# Patient Record
Sex: Female | Born: 1940 | Race: White | Hispanic: No | Marital: Married | Smoking: Never smoker
Health system: Southern US, Community
[De-identification: ages and names within clinical notes are randomized; demographics above are authoritative.]

## PROBLEM LIST (undated history)

## (undated) DIAGNOSIS — C801 Malignant (primary) neoplasm, unspecified: Secondary | ICD-10-CM

## (undated) DIAGNOSIS — Z923 Personal history of irradiation: Secondary | ICD-10-CM

## (undated) DIAGNOSIS — M810 Age-related osteoporosis without current pathological fracture: Secondary | ICD-10-CM

## (undated) DIAGNOSIS — Z87442 Personal history of urinary calculi: Secondary | ICD-10-CM

## (undated) DIAGNOSIS — C50919 Malignant neoplasm of unspecified site of unspecified female breast: Secondary | ICD-10-CM

## (undated) HISTORY — PX: SPINAL FUSION: SHX223

## (undated) HISTORY — PX: TUBAL LIGATION: SHX77

## (undated) HISTORY — PX: BREAST LUMPECTOMY: SHX2

## (undated) HISTORY — PX: BACK SURGERY: SHX140

## (undated) HISTORY — PX: COLONOSCOPY: SHX174

## (undated) HISTORY — PX: BREAST BIOPSY: SHX20

## (undated) HISTORY — PX: BREAST SURGERY: SHX581

## (undated) HISTORY — DX: Age-related osteoporosis without current pathological fracture: M81.0

---

## 1999-05-29 ENCOUNTER — Encounter: Payer: Self-pay | Admitting: Geriatric Medicine

## 1999-05-29 ENCOUNTER — Encounter: Admission: RE | Admit: 1999-05-29 | Discharge: 1999-05-29 | Payer: Self-pay | Admitting: Geriatric Medicine

## 1999-12-14 ENCOUNTER — Encounter: Payer: Self-pay | Admitting: Obstetrics and Gynecology

## 1999-12-14 ENCOUNTER — Encounter: Admission: RE | Admit: 1999-12-14 | Discharge: 1999-12-14 | Payer: Self-pay | Admitting: Obstetrics and Gynecology

## 1999-12-28 ENCOUNTER — Encounter: Admission: RE | Admit: 1999-12-28 | Discharge: 1999-12-28 | Payer: Self-pay | Admitting: Obstetrics and Gynecology

## 1999-12-28 ENCOUNTER — Encounter: Payer: Self-pay | Admitting: Obstetrics and Gynecology

## 2000-01-25 ENCOUNTER — Other Ambulatory Visit: Admission: RE | Admit: 2000-01-25 | Discharge: 2000-01-25 | Payer: Self-pay | Admitting: Obstetrics and Gynecology

## 2000-12-30 ENCOUNTER — Encounter: Payer: Self-pay | Admitting: Obstetrics and Gynecology

## 2000-12-30 ENCOUNTER — Encounter: Admission: RE | Admit: 2000-12-30 | Discharge: 2000-12-30 | Payer: Self-pay | Admitting: Obstetrics and Gynecology

## 2001-01-14 ENCOUNTER — Other Ambulatory Visit: Admission: RE | Admit: 2001-01-14 | Discharge: 2001-01-14 | Payer: Self-pay | Admitting: Obstetrics and Gynecology

## 2001-12-31 ENCOUNTER — Encounter: Admission: RE | Admit: 2001-12-31 | Discharge: 2001-12-31 | Payer: Self-pay | Admitting: Obstetrics and Gynecology

## 2001-12-31 ENCOUNTER — Encounter: Payer: Self-pay | Admitting: Obstetrics and Gynecology

## 2003-01-04 ENCOUNTER — Encounter: Admission: RE | Admit: 2003-01-04 | Discharge: 2003-01-04 | Payer: Self-pay | Admitting: Obstetrics and Gynecology

## 2003-01-14 ENCOUNTER — Encounter: Admission: RE | Admit: 2003-01-14 | Discharge: 2003-01-14 | Payer: Self-pay | Admitting: Obstetrics and Gynecology

## 2003-01-14 ENCOUNTER — Encounter (INDEPENDENT_AMBULATORY_CARE_PROVIDER_SITE_OTHER): Payer: Self-pay | Admitting: Radiology

## 2003-01-14 ENCOUNTER — Encounter (INDEPENDENT_AMBULATORY_CARE_PROVIDER_SITE_OTHER): Payer: Self-pay | Admitting: Specialist

## 2003-01-25 ENCOUNTER — Encounter (HOSPITAL_COMMUNITY): Admission: RE | Admit: 2003-01-25 | Discharge: 2003-04-25 | Payer: Self-pay | Admitting: General Surgery

## 2003-02-15 ENCOUNTER — Ambulatory Visit (HOSPITAL_COMMUNITY): Admission: RE | Admit: 2003-02-15 | Discharge: 2003-02-15 | Payer: Self-pay | Admitting: General Surgery

## 2003-02-15 ENCOUNTER — Encounter (INDEPENDENT_AMBULATORY_CARE_PROVIDER_SITE_OTHER): Payer: Self-pay | Admitting: Specialist

## 2003-02-15 ENCOUNTER — Ambulatory Visit (HOSPITAL_BASED_OUTPATIENT_CLINIC_OR_DEPARTMENT_OTHER): Admission: RE | Admit: 2003-02-15 | Discharge: 2003-02-15 | Payer: Self-pay | Admitting: General Surgery

## 2003-03-17 ENCOUNTER — Ambulatory Visit: Admission: RE | Admit: 2003-03-17 | Discharge: 2003-03-29 | Payer: Self-pay | Admitting: Radiation Oncology

## 2003-03-25 ENCOUNTER — Ambulatory Visit (HOSPITAL_COMMUNITY): Admission: RE | Admit: 2003-03-25 | Discharge: 2003-03-25 | Payer: Self-pay | Admitting: Oncology

## 2003-03-31 ENCOUNTER — Ambulatory Visit: Admission: RE | Admit: 2003-03-31 | Discharge: 2003-06-04 | Payer: Self-pay | Admitting: Radiation Oncology

## 2003-05-04 ENCOUNTER — Ambulatory Visit (HOSPITAL_COMMUNITY): Admission: RE | Admit: 2003-05-04 | Discharge: 2003-05-04 | Payer: Self-pay | Admitting: Oncology

## 2003-07-06 ENCOUNTER — Ambulatory Visit: Admission: RE | Admit: 2003-07-06 | Discharge: 2003-07-06 | Payer: Self-pay | Admitting: Radiation Oncology

## 2003-11-03 ENCOUNTER — Encounter: Admission: RE | Admit: 2003-11-03 | Discharge: 2003-11-03 | Payer: Self-pay | Admitting: General Surgery

## 2003-12-15 ENCOUNTER — Ambulatory Visit (HOSPITAL_COMMUNITY): Admission: RE | Admit: 2003-12-15 | Discharge: 2003-12-15 | Payer: Self-pay | Admitting: Gastroenterology

## 2004-02-16 ENCOUNTER — Encounter (INDEPENDENT_AMBULATORY_CARE_PROVIDER_SITE_OTHER): Payer: Self-pay | Admitting: *Deleted

## 2004-02-16 ENCOUNTER — Ambulatory Visit (HOSPITAL_BASED_OUTPATIENT_CLINIC_OR_DEPARTMENT_OTHER): Admission: RE | Admit: 2004-02-16 | Discharge: 2004-02-16 | Payer: Self-pay | Admitting: Surgery

## 2004-02-16 ENCOUNTER — Ambulatory Visit (HOSPITAL_COMMUNITY): Admission: RE | Admit: 2004-02-16 | Discharge: 2004-02-16 | Payer: Self-pay | Admitting: Surgery

## 2004-05-26 ENCOUNTER — Ambulatory Visit: Payer: Self-pay | Admitting: Oncology

## 2004-11-17 ENCOUNTER — Ambulatory Visit: Payer: Self-pay | Admitting: Oncology

## 2004-12-06 ENCOUNTER — Encounter: Admission: RE | Admit: 2004-12-06 | Discharge: 2004-12-06 | Payer: Self-pay | Admitting: Obstetrics and Gynecology

## 2005-05-25 ENCOUNTER — Ambulatory Visit: Payer: Self-pay | Admitting: Oncology

## 2005-05-28 LAB — COMPREHENSIVE METABOLIC PANEL
ALT: 14 U/L (ref 0–40)
Albumin: 4.4 g/dL (ref 3.5–5.2)
BUN: 16 mg/dL (ref 6–23)
CO2: 27 mEq/L (ref 19–32)
Calcium: 8.9 mg/dL (ref 8.4–10.5)
Chloride: 105 mEq/L (ref 96–112)
Creatinine, Ser: 0.7 mg/dL (ref 0.4–1.2)
Potassium: 4.1 mEq/L (ref 3.5–5.3)

## 2005-05-28 LAB — CBC WITH DIFFERENTIAL/PLATELET
Basophils Absolute: 0 10*3/uL (ref 0.0–0.1)
Eosinophils Absolute: 0.1 10*3/uL (ref 0.0–0.5)
HCT: 38.2 % (ref 34.8–46.6)
HGB: 13.2 g/dL (ref 11.6–15.9)
LYMPH%: 33.2 % (ref 14.0–48.0)
MONO#: 0.4 10*3/uL (ref 0.1–0.9)
NEUT#: 2.8 10*3/uL (ref 1.5–6.5)
NEUT%: 56.7 % (ref 39.6–76.8)
Platelets: 258 10*3/uL (ref 145–400)
WBC: 5 10*3/uL (ref 3.9–10.0)
lymph#: 1.7 10*3/uL (ref 0.9–3.3)

## 2006-01-01 ENCOUNTER — Encounter: Admission: RE | Admit: 2006-01-01 | Discharge: 2006-01-01 | Payer: Self-pay | Admitting: Oncology

## 2006-01-01 ENCOUNTER — Ambulatory Visit (HOSPITAL_COMMUNITY): Admission: RE | Admit: 2006-01-01 | Discharge: 2006-01-01 | Payer: Self-pay | Admitting: Oncology

## 2006-05-10 ENCOUNTER — Encounter (INDEPENDENT_AMBULATORY_CARE_PROVIDER_SITE_OTHER): Payer: Self-pay | Admitting: *Deleted

## 2006-05-10 ENCOUNTER — Observation Stay (HOSPITAL_COMMUNITY): Admission: EM | Admit: 2006-05-10 | Discharge: 2006-05-11 | Payer: Self-pay | Admitting: Emergency Medicine

## 2006-05-29 ENCOUNTER — Ambulatory Visit: Payer: Self-pay | Admitting: Oncology

## 2006-06-03 LAB — COMPREHENSIVE METABOLIC PANEL
ALT: 15 U/L (ref 0–35)
AST: 19 U/L (ref 0–37)
Albumin: 4.3 g/dL (ref 3.5–5.2)
CO2: 27 mEq/L (ref 19–32)
Calcium: 9.5 mg/dL (ref 8.4–10.5)
Chloride: 105 mEq/L (ref 96–112)
Creatinine, Ser: 0.72 mg/dL (ref 0.40–1.20)
Potassium: 4.5 mEq/L (ref 3.5–5.3)
Total Protein: 7.1 g/dL (ref 6.0–8.3)

## 2006-06-03 LAB — CBC WITH DIFFERENTIAL/PLATELET
BASO%: 0.5 % (ref 0.0–2.0)
EOS%: 1.4 % (ref 0.0–7.0)
HCT: 37.6 % (ref 34.8–46.6)
HGB: 13.3 g/dL (ref 11.6–15.9)
MCHC: 35.4 g/dL (ref 32.0–36.0)
MONO#: 0.4 10*3/uL (ref 0.1–0.9)
NEUT%: 53.7 % (ref 39.6–76.8)
RDW: 13.4 % (ref 11.3–14.5)
WBC: 4.1 10*3/uL (ref 3.9–10.0)
lymph#: 1.4 10*3/uL (ref 0.9–3.3)

## 2006-12-26 ENCOUNTER — Encounter: Admission: RE | Admit: 2006-12-26 | Discharge: 2006-12-26 | Payer: Self-pay | Admitting: Geriatric Medicine

## 2007-01-14 ENCOUNTER — Encounter: Admission: RE | Admit: 2007-01-14 | Discharge: 2007-01-14 | Payer: Self-pay | Admitting: Obstetrics and Gynecology

## 2007-05-20 ENCOUNTER — Ambulatory Visit: Payer: Self-pay | Admitting: Oncology

## 2007-05-22 LAB — CBC WITH DIFFERENTIAL/PLATELET
Eosinophils Absolute: 0.1 10*3/uL (ref 0.0–0.5)
LYMPH%: 32.3 % (ref 14.0–48.0)
MCV: 99.8 fL (ref 81.0–101.0)
MONO%: 8.7 % (ref 0.0–13.0)
NEUT#: 2.8 10*3/uL (ref 1.5–6.5)
NEUT%: 57.4 % (ref 39.6–76.8)
Platelets: 243 10*3/uL (ref 145–400)
RBC: 3.62 10*6/uL — ABNORMAL LOW (ref 3.70–5.32)

## 2007-05-22 LAB — COMPREHENSIVE METABOLIC PANEL
Alkaline Phosphatase: 47 U/L (ref 39–117)
BUN: 16 mg/dL (ref 6–23)
Creatinine, Ser: 0.68 mg/dL (ref 0.40–1.20)
Glucose, Bld: 132 mg/dL — ABNORMAL HIGH (ref 70–99)
Sodium: 141 mEq/L (ref 135–145)
Total Bilirubin: 1.3 mg/dL — ABNORMAL HIGH (ref 0.3–1.2)
Total Protein: 6.9 g/dL (ref 6.0–8.3)

## 2008-01-16 ENCOUNTER — Encounter: Admission: RE | Admit: 2008-01-16 | Discharge: 2008-01-16 | Payer: Self-pay | Admitting: Obstetrics and Gynecology

## 2008-05-21 ENCOUNTER — Ambulatory Visit: Payer: Self-pay | Admitting: Oncology

## 2008-05-25 LAB — CBC WITH DIFFERENTIAL/PLATELET
BASO%: 0.4 % (ref 0.0–2.0)
EOS%: 0.9 % (ref 0.0–7.0)
MCH: 35.2 pg — ABNORMAL HIGH (ref 25.1–34.0)
MCHC: 34.3 g/dL (ref 31.5–36.0)
RBC: 3.68 10*6/uL — ABNORMAL LOW (ref 3.70–5.45)
RDW: 13.1 % (ref 11.2–14.5)
lymph#: 1.2 10*3/uL (ref 0.9–3.3)

## 2008-05-26 LAB — COMPREHENSIVE METABOLIC PANEL
ALT: 14 U/L (ref 0–35)
AST: 20 U/L (ref 0–37)
Albumin: 4.3 g/dL (ref 3.5–5.2)
Calcium: 9 mg/dL (ref 8.4–10.5)
Chloride: 105 mEq/L (ref 96–112)
Creatinine, Ser: 0.71 mg/dL (ref 0.40–1.20)
Potassium: 4.1 mEq/L (ref 3.5–5.3)

## 2008-08-26 ENCOUNTER — Ambulatory Visit: Payer: Self-pay | Admitting: Oncology

## 2008-12-23 ENCOUNTER — Ambulatory Visit: Payer: Self-pay | Admitting: Oncology

## 2009-01-05 LAB — CYP450

## 2009-01-17 ENCOUNTER — Encounter: Admission: RE | Admit: 2009-01-17 | Discharge: 2009-01-17 | Payer: Self-pay | Admitting: Obstetrics and Gynecology

## 2009-01-21 ENCOUNTER — Emergency Department (HOSPITAL_COMMUNITY): Admission: EM | Admit: 2009-01-21 | Discharge: 2009-01-21 | Payer: Self-pay | Admitting: Emergency Medicine

## 2009-05-11 ENCOUNTER — Ambulatory Visit: Payer: Self-pay | Admitting: Oncology

## 2009-05-13 LAB — COMPREHENSIVE METABOLIC PANEL
ALT: 16 U/L (ref 0–35)
AST: 23 U/L (ref 0–37)
Albumin: 4.1 g/dL (ref 3.5–5.2)
Alkaline Phosphatase: 44 U/L (ref 39–117)
BUN: 11 mg/dL (ref 6–23)
CO2: 29 mEq/L (ref 19–32)
Calcium: 8.7 mg/dL (ref 8.4–10.5)
Chloride: 106 mEq/L (ref 96–112)
Creatinine, Ser: 0.64 mg/dL (ref 0.40–1.20)
Glucose, Bld: 90 mg/dL (ref 70–99)
Potassium: 3.9 mEq/L (ref 3.5–5.3)
Sodium: 140 mEq/L (ref 135–145)
Total Bilirubin: 1 mg/dL (ref 0.3–1.2)
Total Protein: 6.5 g/dL (ref 6.0–8.3)

## 2009-05-13 LAB — CBC WITH DIFFERENTIAL/PLATELET
BASO%: 0.5 % (ref 0.0–2.0)
Basophils Absolute: 0 10*3/uL (ref 0.0–0.1)
EOS%: 0.7 % (ref 0.0–7.0)
Eosinophils Absolute: 0 10*3/uL (ref 0.0–0.5)
HCT: 36.9 % (ref 34.8–46.6)
HGB: 12.6 g/dL (ref 11.6–15.9)
LYMPH%: 29.5 % (ref 14.0–49.7)
MCH: 36 pg — ABNORMAL HIGH (ref 25.1–34.0)
MCHC: 34.3 g/dL (ref 31.5–36.0)
MCV: 105 fL — ABNORMAL HIGH (ref 79.5–101.0)
MONO#: 0.5 10*3/uL (ref 0.1–0.9)
MONO%: 8.4 % (ref 0.0–14.0)
NEUT#: 3.3 10*3/uL (ref 1.5–6.5)
NEUT%: 60.9 % (ref 38.4–76.8)
Platelets: 199 10*3/uL (ref 145–400)
RBC: 3.51 10*6/uL — ABNORMAL LOW (ref 3.70–5.45)
RDW: 12.8 % (ref 11.2–14.5)
WBC: 5.4 10*3/uL (ref 3.9–10.3)
lymph#: 1.6 10*3/uL (ref 0.9–3.3)

## 2009-05-13 LAB — VITAMIN D 25 HYDROXY (VIT D DEFICIENCY, FRACTURES): Vit D, 25-Hydroxy: 35 ng/mL (ref 30–89)

## 2009-05-13 LAB — CANCER ANTIGEN 27.29: CA 27.29: 15 U/mL (ref 0–39)

## 2010-01-18 ENCOUNTER — Encounter
Admission: RE | Admit: 2010-01-18 | Discharge: 2010-01-18 | Payer: Self-pay | Source: Home / Self Care | Admitting: Obstetrics and Gynecology

## 2010-03-04 ENCOUNTER — Encounter: Payer: Self-pay | Admitting: Oncology

## 2010-04-13 ENCOUNTER — Other Ambulatory Visit: Payer: Self-pay | Admitting: Dermatology

## 2010-05-12 ENCOUNTER — Encounter (HOSPITAL_BASED_OUTPATIENT_CLINIC_OR_DEPARTMENT_OTHER): Payer: Medicare Other | Admitting: Oncology

## 2010-05-12 ENCOUNTER — Other Ambulatory Visit: Payer: Self-pay | Admitting: Oncology

## 2010-05-12 DIAGNOSIS — C50419 Malignant neoplasm of upper-outer quadrant of unspecified female breast: Secondary | ICD-10-CM

## 2010-05-12 DIAGNOSIS — Z170 Estrogen receptor positive status [ER+]: Secondary | ICD-10-CM

## 2010-05-12 LAB — CBC WITH DIFFERENTIAL/PLATELET
BASO%: 0.4 % (ref 0.0–2.0)
Basophils Absolute: 0 10*3/uL (ref 0.0–0.1)
HCT: 35.3 % (ref 34.8–46.6)
HGB: 12.2 g/dL (ref 11.6–15.9)
MONO#: 0.3 10*3/uL (ref 0.1–0.9)
NEUT#: 3.1 10*3/uL (ref 1.5–6.5)
NEUT%: 56.3 % (ref 38.4–76.8)
WBC: 5.4 10*3/uL (ref 3.9–10.3)
lymph#: 2 10*3/uL (ref 0.9–3.3)

## 2010-05-13 LAB — COMPREHENSIVE METABOLIC PANEL
ALT: 15 U/L (ref 0–35)
BUN: 12 mg/dL (ref 6–23)
CO2: 26 mEq/L (ref 19–32)
Calcium: 8.8 mg/dL (ref 8.4–10.5)
Chloride: 105 mEq/L (ref 96–112)
Creatinine, Ser: 0.77 mg/dL (ref 0.40–1.20)

## 2010-05-13 LAB — CANCER ANTIGEN 27.29: CA 27.29: 18 U/mL (ref 0–39)

## 2010-05-16 LAB — URINALYSIS, ROUTINE W REFLEX MICROSCOPIC
Glucose, UA: NEGATIVE mg/dL
Protein, ur: NEGATIVE mg/dL
Urobilinogen, UA: 0.2 mg/dL (ref 0.0–1.0)

## 2010-05-25 ENCOUNTER — Encounter (HOSPITAL_BASED_OUTPATIENT_CLINIC_OR_DEPARTMENT_OTHER): Payer: Medicare Other | Admitting: Oncology

## 2010-05-25 ENCOUNTER — Other Ambulatory Visit: Payer: Self-pay | Admitting: Oncology

## 2010-05-25 DIAGNOSIS — Z170 Estrogen receptor positive status [ER+]: Secondary | ICD-10-CM

## 2010-05-25 DIAGNOSIS — Z9889 Other specified postprocedural states: Secondary | ICD-10-CM

## 2010-05-25 DIAGNOSIS — C50419 Malignant neoplasm of upper-outer quadrant of unspecified female breast: Secondary | ICD-10-CM

## 2010-05-25 DIAGNOSIS — M810 Age-related osteoporosis without current pathological fracture: Secondary | ICD-10-CM

## 2010-06-30 NOTE — Op Note (Signed)
NAMEANAVI, BRANSCUM                 ACCOUNT NO.:  [id number]   MEDICAL RECORD NO.:  [id number]          PATIENT TYPE:  INP   LOCATION:  1844                         FACILITY:  [facility]   PHYSICIAN:  Currie Paris, M.D.DATE OF BIRTH:  [birth year]   DATE OF PROCEDURE:  05/10/2006  DATE OF DISCHARGE:                               OPERATIVE REPORT   PREOPERATIVE DIAGNOSIS:  Acute calculus cholecystitis.   POSTOPERATIVE DIAGNOSIS:  Acute calculus cholecystitis.   OPERATION:  Laparoscopic cholecystectomy with operative cholangiogram.   SURGEON:  Currie Paris, M.D.   ASSISTANT:  Ollen Gross. Vernell Morgans, M.D.   ANESTHESIA:  General.   CLINICAL HISTORY:  Ms. Rake presented to the emergency room this  morning with acute abdominal pain primarily epigastric right upper  quadrant, associated with nausea and vomiting.  Large stones were seen  in the gallbladder with one apparently impacted in the neck of the  gallbladder.  The diagnosis of acute cholecystitis was made.  We  recommend cholecystectomy done today and she was agreeable to that.  All  questions have been answered.   DESCRIPTION OF PROCEDURE:  The patient was seen in the holding area and  she had no further questions.  We confirmed cholecystectomy as the  planned procedure.   She was taken to the operating room and after satisfactory general  endotracheal anesthesia had been obtained, the abdomen was prepped and  draped.  The time-out occurred.   We used 0.25% plain Marcaine for each incision.  The umbilical incision  was made, the fascia opened, and the peritoneal cavity entered under  direct vision.  A pursestring was placed, the Hasson introduced and the  abdomen insufflated to 15.  Three additional trocars were placed using a  10-11 in the epigastrium and two 5s laterally all placed under direct  vision.   The gallbladder was tense, distended and inflamed.  I used the [employer] and decompressed the  gallbladder.   With some graspers on the gallbladder, I was able to take some  peritoneal adhesions down.  There was a large stone in the neck of the  gallbladder and we grasped the gallbladder around that to retract and  with some gentle dissection and hydrodissection identified the cystic  duct and was able to get a segment of that about 2-2.5 cm long.  I could  see the common duct and the gallbladder.  In addition, I could see the  anterior branch of the cystic artery and we dissected that out.  I put  two clips on the cystic artery, one on the cystic duct at the junction  with the gallbladder.   The cystic duct was opened and a Cook catheter introduced percutaneously  and operative cholangiogram done.  This appeared normal.   Cystic duct catheter was removed and three clips placed on the stay side  of the cystic duct and it was divided.  Additional clip was placed on  the anterior branch of the cystic artery and it was divided.  Further  dissection revealed what appeared to be either be a  large main cystic  artery or an aberrantly placed right hepatic artery with a posterior  cystic artery coming directly off of this.  The anterior cystic artery  also came off this as we could see once it was to divided and retracted  down.  Posterior branch was clipped and divided and the gallbladder then  removed from below to above.  Because of the edema,  there was no good  dissection plane and there was some oozing from the liver bed initially  but this was controlled once the gallbladder was disconnected.  We put  it in a bag while we irrigated and got everything dry.  I placed some  pieces of Surgicel on the bed of the gallbladder.   The gallbladder was removed through the umbilical port.  I had to  enlarge both the skin and fascia a little bit to get the large stones  out.  This came down nicely.   The Roseanne Reno was reintroduced and we spent some more time irrigating,  making sure  everything was dry.  The Surgicel had no collection of blood  developing on it while we were manipulating the gallbladder out.  I did  put one more piece of Surgicel in.   Lateral ports were removed under direct vision.  The pursestring plus an  additional figure-of-eight suture were used to close the umbilical site.  The abdomen was deflated through the epigastric site.  Skin was closed  with 4-0 Monocryl subcuticular plus Dermabond.   The patient tolerated the procedure well.  There were no operative  complications.  All counts were correct.      Currie Paris, M.D.  Electronically Signed     CJS/MEDQ  D:  05/10/2006  T:  05/10/2006  Job:  [id number]   cc:   Hal T. Stoneking, M.D.

## 2010-06-30 NOTE — H&P (Signed)
[place]                 ACCOUNT NO.:  [id number]   MEDICAL RECORD NO.:  [id number]          PATIENT TYPE:  EMS   LOCATION:  MAJO                         FACILITY:  [facility]   PHYSICIAN:  Currie Paris, M.D.DATE OF BIRTH:  1940-04-22   DATE OF ADMISSION:  05/10/2006  DATE OF DISCHARGE:                              HISTORY & PHYSICAL   CHIEF COMPLAINT:  Right upper quadrant pain.   HISTORY OF PRESENT ILLNESS:  This patient is a 70 year old lady who has  been having intermittent indigestion type symptoms for many months.  She  apparently had similar symptoms 10 years ago with a negative workup.  Because of her ongoing symptoms, she was getting ready to have  additional workup next week by Dr. Pete Glatter.   Yesterday, she developed much more severe pain late afternoon.  It was  epigastric and right upper quadrant.  It was associated with intractable  nausea and vomiting.  Because of that, she presented to the emergency  room.  She is been here most of the evening and still having some right  upper quadrant pain and nausea.  The patient has had no prior diagnosis  of gallstones.   The patient notes that the pain was not really made better or worse by  anything that she did in terms of moving, etc.  She did get some relief  with pain medication apparently.   PAST SURGICAL HISTORY:  She had a lumpectomy for breast cancer.   MEDICATIONS:  She is on Detrol, Fosamax, and Exemestane.   ALLERGIES:  No known allergies.   SOCIAL HISTORY:  She does not smoke, drinks occasionally.  She exercises  regularly, walks daily and works out at [employer].  She is married.   REVIEW OF SYSTEMS:  HEENT: Basically negative.  CHEST: No cough,  shortness of breath.  HEART:  No history of murmurs, hypertension, chest  pain, angina. BREASTS:  A history of breast cancer.  ABDOMEN:  Other  than current symptoms, no other GI problems.  GU: Has bladder  dysfunction and takes Detrol.  EXTREMITIES:   Negative.   PHYSICAL EXAMINATION:  VITAL SIGNS:  Temperature 97, pulse 83,  respirations 18, blood pressure 150/76.  GENERAL APPEARANCE:  Patient is alert, oriented, well-nourished, well-  developed female who is slightly uncomfortable but in no acute distress.  Mood and affect are normal.  HEENT:  Head normocephalic.  Eyes nonicteric.  Pupils equal, round and  regular.  Pharynx:  Good dentition.  Normal mucosa.  NECK:  Supple.  No masses, no thyromegaly.  LUNGS:  Normal respirations.  Clear to auscultation.  HEART:  Regular rhythm.  No murmurs, rubs or gallops.  Peripheral pulses  intact.  Dorsalis pedis, posterior tibial.  No varicosities noted.  BREASTS:  Status post eye lumpectomy.  ABDOMEN:  Mild right upper quadrant tenderness, but the abdomen has no  guarding.  It is soft.  There is no rebound tenderness.  There is no  masses palpable.  There is no organomegaly noted.  She has no hernias  noted.  EXTREMITIES:  No cyanosis or edema.  Good range of motion noted.   LABORATORY DATA:  White count is 10,900.  There is 88% neutrophils.  Liver functions appear normal with a total bilirubin of 1.2.  Hemoglobin  is 14.  Lipase is 27.   Gallbladder ultrasound shows approximately 2 cm stone which appears  impacted in the neck of the gallbladder.   Chest x-ray and EKG are pending.   IMPRESSION:  1. Acute cholecystitis and cholelithiasis.  2. History of breast cancer in remission.   PLAN:  We are going to continue to give her some IV fluids, pain  medication.  I will start her on some Ancef.  Will plan to do a  laparoscopic cholecystectomy today.  I have discussed the risks and  complications with the patient and her husband and I think all questions  have been answered.      Currie Paris, M.D.  Electronically Signed     CJS/MEDQ  D:  05/10/2006  T:  05/10/2006  Job:  [id number]   cc:   Hal T. Stoneking, M.D.  Valentino Hue. Magrinat, M.D.

## 2010-06-30 NOTE — Op Note (Signed)
NAMEJACINTA, [place]                 ACCOUNT NO.:  [id number]   MEDICAL RECORD NO.:  [id number]          PATIENT TYPE:  AMB   LOCATION:  ENDO                         FACILITY:  [facility]   PHYSICIAN:  Danise Edge, M.D.   DATE OF BIRTH:  [birth year]   DATE OF PROCEDURE:  12/15/2003  DATE OF DISCHARGE:                                 OPERATIVE REPORT   INDICATIONS FOR PROCEDURE:  Ms. [name] is a 70 year old female born  Sep 17, 1940.  Ms. [name] was scheduled to undergo her first screening  colonoscopy with polypectomy to prevent colon cancer.  Due to colonic loop  formation, I was only able to perform a flexible proctosigmoidoscopy to 50  cm.   ENDOSCOPIST:  Danise Edge, M.D.   PREMEDICATION:  Versed 6 mg and Demerol 60 mg.   DESCRIPTION OF PROCEDURE:  After obtaining informed consent, Ms. [name] was  placed in the left lateral decubitus position.  I administered intravenous  Demerol and intravenous Versed to achieve conscious sedation for the  procedure.  The patient's blood pressure, oxygen saturation and cardiac  rhythm were monitored throughout the procedure and documented in the medical  record. Anal inspection and digital rectal examination were normal.  The  Olympus adjustable pediatric colonoscope was introduced into the rectum and  advanced to 50 cm from the anal verge.  Despite patient repositioning and  the application of external abdominal pressure, I was unable to advance the  colonoscope proximally and a complete colonoscopy was not performed.  Colonic preparation for the examination today was excellent.   Endoscopic appearance of the rectum and sigmoid colon with the colonoscope  advanced to 50 cm from the anal verge reveals left colonic diverticulosis  but no endoscopic evidence for the presence of colorectal neoplasia.   I will refer Ms. [name] to the radiology suite for an air contrast barium  enema to screen the remainder of her colon.      MJ/MEDQ  D:   12/15/2003  T:  12/15/2003  Job:  [id number]   cc:   Hal T. Stoneking, M.D.  [address] [place], [place] [address]  Fax: [phone]

## 2010-06-30 NOTE — Op Note (Signed)
Caroline Harper, [place]                           ACCOUNT NO.:  [id number]   MEDICAL RECORD NO.:  [id number]                   PATIENT TYPE:  AMB   LOCATION:  DSC                                  FACILITY:  [facility]   PHYSICIAN:  Rose Phi. Maple Hudson, M.D.                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   POSTOPERATIVE DIAGNOSIS:  Stage I carcinoma of the left breast.   OPERATION:  1. Blue dye injection.  2. Left sentinel lymph node biopsy.  3. Left lumpectomy.   SURGEON:  Rose Phi. Maple Hudson, M.D.   ANESTHESIA:  General.   DESCRIPTION OF PROCEDURE:  Prior to coming to the operating room 1 mc of  technetium sulphur colloid was injected intradermally, and then she was  brought to the operating room where general anesthesia was induced, and she  was placed in the supine position, with both arms extended on the arm board.  Then 5 mL of a mixture of 2 mL of methylene blue and 3 mL of saline was  injected into the subareolar tissue and the breast gently massaged for about  three minutes.  After prepping and draping the breast and axilla, we carefully scanned the  axilla with the Neo-Probe and she had one good hot spot.  A short transverse  axillary incision was made with dissection through the subcutaneous tissue  to the clavipectoral fascia.  A blue and hot sentinel node was identified  and removed and submitted to the pathologist.  There were no other blue hot  or palpable nodes.  While that was being evaluated, a curved incision over the palpable area in  the upper outer quadrant of her left breast was made, and a wide excision  carried out.  The specimen oriented for the pathologist.  Touch preps of the sentinel node were negative.  Touch preps of the margins  were clean.  We had good hemostasis  with the cautery, and then closed the  wounds with #3-0 Vicryl and  subcuticular #4-0 Monocryl and Steri-Strips.  Dressings applied.  The patient was transferred to the recovery room in satisfactory condition,  having tolerated the procedure well.                                               Rose Phi. Maple Hudson, M.D.    PRY/MEDQ  D:  02/15/2003  T:  02/15/2003  Job:  [id number]   cc:   Hal T. Stoneking, M.D.  [address]  [place], [place] [address]  Fax: [phone]   Tracey Harries, M.D.

## 2010-06-30 NOTE — Op Note (Signed)
Caroline Harper, [place] Harper                 ACCOUNT NO.:  [id number]   MEDICAL RECORD NO.:  [id number]          PATIENT TYPE:  AMB   LOCATION:  DSC                          FACILITY:  [facility]   PHYSICIAN:  Currie Paris, M.D.DATE OF BIRTH:  13-Oct-1940   DATE OF PROCEDURE:  02/16/2004  DATE OF DISCHARGE:                                 OPERATIVE REPORT   PREOPERATIVE DIAGNOSIS:  Pilar cyst x2 of scalp.   POSTOPERATIVE DIAGNOSIS:  Pilar cyst x2 of scalp.   OPERATION/PROCEDURE:  Excision pilar cyst x2 of scalp.   SURGEON:  Currie Paris, M.D.   ANESTHESIA:  Local.   CLINICAL HISTORY:  This patient has had an enlarging left pilar cyst and a  smaller right one that she apparently bumped or at any rate she had a little  skin breakdown on the surface that had not healed.  She decided to have both  of these electively removed.   DESCRIPTION OF PROCEDURE:  The patient was seen in the minor procedure room  and both were identified and marked. The hair was clipped over each of them  and they were anesthetized with 1% Xylocaine with epinephrine, using a total  of 10 mL.   I waited about 10 minutes and then began on the anterior one which had the  nonhealing area.  It was excised in toto and the incision closed with 3-0  Monocryl plus some Dermabond.   The patient was repositioned to get the larger one more posteriorly and this  was also prepped with Betadine, an elliptical incision made and it was  excised and closed with 3-0 Monocryl.  Dermabond was also used.  The patient  tolerated the procedure well.  The smaller anterior incision was about 1 cm  and the posterior one was 3 cm.  The patient tolerated the procedure well.  There were no complications noted.      Chri   CJS/MEDQ  D:  02/16/2004  T:  02/16/2004  Job:  [id number]

## 2010-12-27 ENCOUNTER — Other Ambulatory Visit: Payer: Self-pay | Admitting: *Deleted

## 2010-12-27 MED ORDER — TAMOXIFEN CITRATE 20 MG PO TABS: 20.0000 mg | ORAL_TABLET | Freq: Every day | ORAL | Status: AC

## 2011-01-22 ENCOUNTER — Ambulatory Visit
Admission: RE | Admit: 2011-01-22 | Discharge: 2011-01-22 | Disposition: A | Discharge status: Home / Self Care | Payer: Medicare Other | Source: Ambulatory Visit | Attending: Oncology | Admitting: Oncology

## 2011-01-22 DIAGNOSIS — Z9889 Other specified postprocedural states: Secondary | ICD-10-CM

## 2011-05-01 ENCOUNTER — Other Ambulatory Visit: Payer: Self-pay | Admitting: Dermatology

## 2011-05-09 ENCOUNTER — Encounter: Payer: Self-pay | Admitting: *Deleted

## 2011-05-28 ENCOUNTER — Other Ambulatory Visit: Payer: Medicare Other | Admitting: Lab

## 2011-05-29 ENCOUNTER — Other Ambulatory Visit: Payer: Self-pay | Admitting: Dermatology

## 2011-05-30 ENCOUNTER — Telehealth: Payer: Self-pay | Admitting: Oncology

## 2011-05-30 ENCOUNTER — Ambulatory Visit (HOSPITAL_BASED_OUTPATIENT_CLINIC_OR_DEPARTMENT_OTHER): Payer: Medicare Other | Admitting: Oncology

## 2011-05-30 ENCOUNTER — Other Ambulatory Visit (HOSPITAL_BASED_OUTPATIENT_CLINIC_OR_DEPARTMENT_OTHER): Payer: Medicare Other | Admitting: Lab

## 2011-05-30 VITALS — BP 144/77 | HR 80 | Temp 97.9°F | Ht 66.0 in | Wt 127.8 lb

## 2011-05-30 DIAGNOSIS — Z170 Estrogen receptor positive status [ER+]: Secondary | ICD-10-CM

## 2011-05-30 DIAGNOSIS — C50419 Malignant neoplasm of upper-outer quadrant of unspecified female breast: Secondary | ICD-10-CM

## 2011-05-30 DIAGNOSIS — C50919 Malignant neoplasm of unspecified site of unspecified female breast: Secondary | ICD-10-CM | POA: Insufficient documentation

## 2011-05-30 LAB — CBC WITH DIFFERENTIAL/PLATELET
BASO%: 1.6 % (ref 0.0–2.0)
EOS%: 1.3 % (ref 0.0–7.0)
HCT: 36.4 % (ref 34.8–46.6)
MCH: 35.6 pg — ABNORMAL HIGH (ref 25.1–34.0)
MCHC: 33.9 g/dL (ref 31.5–36.0)
MONO#: 0.4 10*3/uL (ref 0.1–0.9)
NEUT%: 48.4 % (ref 38.4–76.8)
RBC: 3.47 10*6/uL — ABNORMAL LOW (ref 3.70–5.45)
RDW: 13.1 % (ref 11.2–14.5)
WBC: 3.9 10*3/uL (ref 3.9–10.3)
lymph#: 1.5 10*3/uL (ref 0.9–3.3)
nRBC: 0 % (ref 0–0)

## 2011-05-30 MED ORDER — TAMOXIFEN CITRATE 20 MG PO TABS: 20.0000 mg | ORAL_TABLET | Freq: Every day | ORAL | Status: AC

## 2011-05-30 NOTE — Telephone Encounter (Signed)
gve the pt her April 2014 appt calendar °

## 2011-05-30 NOTE — Progress Notes (Signed)
ID: Caroline Harper   DOB: 01/28/1941  MR#: [id number]  WUJ#:[id number]  HISTORY OF PRESENT ILLNESS: Caroline Harper had her routine mammogram at [facility] on November 18th showing an irregularity in the left breast. The right breast was unremarkable.  Spot compression and additional views showed an irregular mass at the 2  o'clock position, 1 cm from the left nipple (December 2nd).  Ultrasound core biopsy was obtained on the same day and showed ([id number]) an invasive mammary carcinoma that was ER/PR positive, HER-2/neu negative.  With this information, the patient was referred to Dr. Francina Harper.  After appropriate discussion, she was referred for left lumpectomy and axillary lymph node biopsy February 15, 2003.  The final pathology there ([id number]) showed a 2.2 cm Grade 1 infiltrating ductal carcinoma with negative margins.  No evidence of lymphovascular invasion, but a micrometastasis in a single lymph node examined.  Her subsequent history is as detailed below  INTERVAL HISTORY: Caroline Harper returns today for followup of her breast cancer. Interval history is quite unremarkable. She's doing a lot of traveling, enjoying her grandchildren when she gets to visit (they're currently in [place]) and generally "feeling great".  REVIEW OF SYSTEMS: She lives 6 months of the year in [place], [place] 6 months she goes to [place]. The other 6 [place] daily walking there. She is tolerating tamoxifen with no side effects that she is aware of. A detailed review of systems was noncontributory  PAST MEDICAL HISTORY: Significant for surgery for treatment of scoliosis as a teenager, and this required some spinal fusion and some correction of her left knee.  She is also status post bilateral tubal ligation.    FAMILY HISTORY The patient's father died at the age of 19 with vasculitis.  Patient's mother died at the age of 26 with congestive heart failure.  The patient has one brother and one sister. There is no  history of cancer in the family.    GYNECOLOGIC HISTORY: She is G2 P2. She did take hormone replacement for a Caroline.  SOCIAL HISTORY: She is a retired Advice worker. She has been married to her husband, [place] Harper, for >40 years.  He is retired from [place]. Their son, [place] Harper, in [place], works for [place]. Their daughter, [place] Harper, is in [place] and has two children.  She is married to an Radio broadcast assistant. The [place] Harper are members of [place] Harper.     ADVANCED DIRECTIVES:  HEALTH MAINTENANCE: History  Substance Use Topics  . Smoking status: Not on file  . Smokeless tobacco: Not on file  . Alcohol Use: Not on file    Allergies no known allergies  Current Outpatient Prescriptions  Medication Sig Dispense Refill  . tamoxifen (NOLVADEX) 20 MG tablet Take 20 mg by mouth daily.        OBJECTIVE: middle-aged white woman who appears healthy Filed Vitals:   05/30/11 0858  BP: 144/77  Pulse: 80  Temp: 97.9 F (36.6 C)     Body mass index is 20.63 kg/(m^2).    ECOG FS: 0  Sclerae unicteric Oropharynx clear No peripheral adenopathy Lungs no rales or rhonchi Heart regular rate and rhythm Abd benign MSK scoliosis butno focal spinal tenderness, no peripheral edema Neuro: nonfocal Breasts: right breast, no suspicious findings; left breast status post lumpectomy, no evidence of local recurrence. Both breasts are small and fairly lumpy for age. There has been no significant change since the last visit to my recollection  LAB RESULTS: Lab  Results  Component Value Date   WBC 3.9 05/30/2011   NEUTROABS 1.9 05/30/2011   HGB 12.3 05/30/2011   HCT 36.4 05/30/2011   MCV 104.9* 05/30/2011   PLT 167 05/30/2011      Chemistry         Lab Results  Component Value Date   LABCA2 18 05/12/2010   LABCA2 18 05/12/2010   LABCA2 18 05/12/2010    No components found with this basename: [id number]    No results found for this basename: INR:1;PROTIME:1 in the last 168  hours  Urinalysis    Component Value Date/Caroline   COLORURINE YELLOW 01/21/2009 0644   APPEARANCEUR CLEAR 01/21/2009 0644   LABSPEC 1.021 01/21/2009 0644   PHURINE 7.5 01/21/2009 0644   GLUCOSEU NEGATIVE 01/21/2009 0644   HGBUR NEGATIVE 01/21/2009 0644   BILIRUBINUR NEGATIVE 01/21/2009 0644   KETONESUR 15* 01/21/2009 0644   PROTEINUR NEGATIVE 01/21/2009 0644   UROBILINOGEN 0.2 01/21/2009 0644   NITRITE NEGATIVE 01/21/2009 0644   LEUKOCYTESUR NEGATIVE MICROSCOPIC NOT DONE ON URINES WITH NEGATIVE PROTEIN, BLOOD, LEUKOCYTES, NITRITE, OR GLUCOSE <1000 mg/dL. 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  Status post radiation therapy.  On exemestane from April of 2005 until April of 2010 per the MA27 study.  Began on tamoxifen 20 mg daily in July of 2010.    PLAN: the plan is to continue tamoxifen 2 total 5 years, which will be July of 2015. We did discuss the recent data suggesting that continuing anti-estrogens for 10 years offers superior protection. She will see [place] again in one year and then again 2 years from now which is when we will discontinue followup. She knows to call for any problems that may develop before the next visit.   Tom Macpherson C    05/30/2011

## 2011-05-31 LAB — COMPREHENSIVE METABOLIC PANEL
AST: 20 U/L (ref 0–37)
Albumin: 4.1 g/dL (ref 3.5–5.2)
Alkaline Phosphatase: 37 U/L — ABNORMAL LOW (ref 39–117)
Glucose, Bld: 92 mg/dL (ref 70–99)
Potassium: 3.7 mEq/L (ref 3.5–5.3)
Sodium: 144 mEq/L (ref 135–145)
Total Bilirubin: 0.9 mg/dL (ref 0.3–1.2)
Total Protein: 6.4 g/dL (ref 6.0–8.3)

## 2011-05-31 LAB — CANCER ANTIGEN 27.29: CA 27.29: 14 U/mL (ref 0–39)

## 2011-06-04 ENCOUNTER — Ambulatory Visit: Payer: Medicare Other | Admitting: Oncology

## 2011-12-25 ENCOUNTER — Other Ambulatory Visit: Payer: Self-pay | Admitting: Oncology

## 2011-12-25 DIAGNOSIS — Z9889 Other specified postprocedural states: Secondary | ICD-10-CM

## 2011-12-25 DIAGNOSIS — Z853 Personal history of malignant neoplasm of breast: Secondary | ICD-10-CM

## 2012-01-23 ENCOUNTER — Ambulatory Visit
Admission: RE | Admit: 2012-01-23 | Discharge: 2012-01-23 | Disposition: A | Discharge status: Home / Self Care | Payer: Medicare Other | Source: Ambulatory Visit | Attending: Oncology | Admitting: Oncology

## 2012-01-23 DIAGNOSIS — Z853 Personal history of malignant neoplasm of breast: Secondary | ICD-10-CM

## 2012-01-23 DIAGNOSIS — Z9889 Other specified postprocedural states: Secondary | ICD-10-CM

## 2012-05-29 ENCOUNTER — Ambulatory Visit: Payer: Medicare Other | Admitting: Physician Assistant

## 2012-05-29 ENCOUNTER — Other Ambulatory Visit: Payer: Medicare Other | Admitting: Lab

## 2012-06-04 ENCOUNTER — Other Ambulatory Visit: Payer: Self-pay | Admitting: Physician Assistant

## 2012-06-04 DIAGNOSIS — C50919 Malignant neoplasm of unspecified site of unspecified female breast: Secondary | ICD-10-CM

## 2012-06-05 ENCOUNTER — Other Ambulatory Visit (HOSPITAL_BASED_OUTPATIENT_CLINIC_OR_DEPARTMENT_OTHER): Payer: Medicare PPO | Admitting: Lab

## 2012-06-05 ENCOUNTER — Encounter: Payer: Self-pay | Admitting: Physician Assistant

## 2012-06-05 ENCOUNTER — Ambulatory Visit (HOSPITAL_BASED_OUTPATIENT_CLINIC_OR_DEPARTMENT_OTHER): Payer: Medicare PPO | Admitting: Physician Assistant

## 2012-06-05 ENCOUNTER — Telehealth: Payer: Self-pay | Admitting: Oncology

## 2012-06-05 VITALS — BP 122/72 | HR 80 | Temp 97.9°F | Resp 20 | Ht 66.0 in | Wt 128.7 lb

## 2012-06-05 DIAGNOSIS — Z780 Asymptomatic menopausal state: Secondary | ICD-10-CM

## 2012-06-05 DIAGNOSIS — Z853 Personal history of malignant neoplasm of breast: Secondary | ICD-10-CM

## 2012-06-05 DIAGNOSIS — C50919 Malignant neoplasm of unspecified site of unspecified female breast: Secondary | ICD-10-CM

## 2012-06-05 DIAGNOSIS — C50419 Malignant neoplasm of upper-outer quadrant of unspecified female breast: Secondary | ICD-10-CM

## 2012-06-05 DIAGNOSIS — M810 Age-related osteoporosis without current pathological fracture: Secondary | ICD-10-CM

## 2012-06-05 DIAGNOSIS — C50912 Malignant neoplasm of unspecified site of left female breast: Secondary | ICD-10-CM

## 2012-06-05 DIAGNOSIS — Z170 Estrogen receptor positive status [ER+]: Secondary | ICD-10-CM

## 2012-06-05 HISTORY — DX: Age-related osteoporosis without current pathological fracture: M81.0

## 2012-06-05 LAB — CBC WITH DIFFERENTIAL/PLATELET
Eosinophils Absolute: 0.1 10*3/uL (ref 0.0–0.5)
HCT: 37.5 % (ref 34.8–46.6)
LYMPH%: 30.7 % (ref 14.0–49.7)
MCHC: 33.9 g/dL (ref 31.5–36.0)
MCV: 104.5 fL — ABNORMAL HIGH (ref 79.5–101.0)
MONO#: 0.5 10*3/uL (ref 0.1–0.9)
MONO%: 7.7 % (ref 0.0–14.0)
NEUT#: 3.6 10*3/uL (ref 1.5–6.5)
NEUT%: 60.4 % (ref 38.4–76.8)
Platelets: 202 10*3/uL (ref 145–400)
WBC: 6 10*3/uL (ref 3.9–10.3)

## 2012-06-05 LAB — COMPREHENSIVE METABOLIC PANEL (CC13)
ALT: 15 U/L (ref 0–55)
Alkaline Phosphatase: 50 U/L (ref 40–150)
Creatinine: 0.7 mg/dL (ref 0.6–1.1)
Sodium: 142 mEq/L (ref 136–145)
Total Bilirubin: 0.53 mg/dL (ref 0.20–1.20)
Total Protein: 6.7 g/dL (ref 6.4–8.3)

## 2012-06-05 MED ORDER — TAMOXIFEN CITRATE 20 MG PO TABS: 20.0000 mg | ORAL_TABLET | Freq: Every day | ORAL | Status: DC

## 2012-06-05 NOTE — Progress Notes (Signed)
ID: Caroline Harper   DOB: [birth year]  MR#: [id number]  CSN#:[id number]  PCP: Ginette Otto, MD GYN:  SU:  OTHER MD:     HISTORY OF PRESENT ILLNESS: Caroline Harper had her routine mammogram at [facility] on December 31, 2002 showing an irregularity in the left breast. The right breast was unremarkable. Spot compression and additional views showed an irregular mass at the 2 o'clock position, 1 cm from the left nipple (December 2nd). Ultrasound core biopsy was obtained on the same day and showed ([id number]) an invasive mammary carcinoma that was ER/PR positive, HER-2/neu negative.   With this information, the patient was referred to Dr. Francina Ames. After appropriate discussion, she was referred for left lumpectomy and axillary lymph node biopsy February 15, 2003. The final pathology there ([id number]) showed a 2.2 cm Grade 1 infiltrating ductal carcinoma with negative margins. No evidence of lymphovascular invasion, but a micrometastasis in a single lymph node examined. Her subsequent history is as detailed below    INTERVAL HISTORY: Caroline Harper returns today for one-year followup of her left breast carcinoma. She continues on tamoxifen with excellent tolerance. She has no problems with hot flashes, no peripheral swelling, abnormal bleeding, or evidence of abnormal clotting. She's noted no change in vision. She denies any vaginal dryness or discharge.  Interval history is remarkable for Caroline Harper having recently returned from a 12 day trip out west. In fact she got back into town at 2:00 AM this morning! Needless to say, she is a little tired today, but tells me that otherwise her energy level is great.  REVIEW OF SYSTEMS: Caroline Harper has had no recent illnesses and denies any fevers, chills, or night sweats. She's had no skin changes or rashes. She's eating and drinking well denies any nausea or change in bowel or bladder habits. She's had no new cough, no shortness of breath, orthopnea, chest pain, or palpitations. She denies any  abnormal headaches, or dizziness, and has had no unusual myalgias, arthralgias, or bony pain.  A detailed review of systems is otherwise noncontributory.   PAST MEDICAL HISTORY: Past Medical History  Diagnosis Date  . Osteoporosis, unspecified 06/05/2012    PAST SURGICAL HISTORY: History reviewed. No pertinent past surgical history. Significant for surgery for treatment of scoliosis as a teenager, and this required some spinal fusion and some correction of her left knee.  She is also status post bilateral tubal ligation.    FAMILY HISTORY History reviewed. No pertinent family history. The patient's father died at the age of 34 with vasculitis.  Patient's mother died at the age of 16 with congestive heart failure.  The patient has one brother and one sister. There is no history of cancer in the family.    GYNECOLOGIC HISTORY: She is G2 P2. She did take hormone replacement for a time.  SOCIAL HISTORY: She is a retired Advice worker. She has been married to her husband, Chestine Spore, for over 40 years.  He is retired from [employer]. Their son, Brett Canales, in [place], works for [employer] [place]. Their daughter, Herbert Seta, is in [place] and has two children.  She is married to an Radio broadcast assistant. The Raneys are members of [employer].      ADVANCED DIRECTIVES: In Place.  Husband, Chestine Spore, is HCPOA.  HEALTH MAINTENANCE: History  Substance Use Topics  . Smoking status: Never Smoker   . Smokeless tobacco: Never Used  . Alcohol Use: Yes     Colonoscopy:  PAP: UTD  Bone density:  May 2012, osteoporosis  Lipid panel:  UTD  No Known Allergies  Current Outpatient Prescriptions  Medication Sig Dispense Refill  . calcium-vitamin D (OSCAL WITH D) 500-200 MG-UNIT per tablet Take 1 tablet by mouth 2 (two) times daily.      . fish oil-omega-3 fatty acids 1000 MG capsule Take 1 g by mouth daily.      . Multiple Vitamin (MULTIVITAMIN) tablet Take 1 tablet by mouth daily.      . tamoxifen  (NOLVADEX) 20 MG tablet Take 1 tablet (20 mg total) by mouth daily.  90 tablet  3  . vitamin C (ASCORBIC ACID) 500 MG tablet Take 500 mg by mouth daily.       No current facility-administered medications for this visit.    OBJECTIVE: Middle-aged Caucasian female who appears comfortable and is in no acute distress Filed Vitals:   06/05/12 1458  BP: 122/72  Pulse: 80  Temp: 97.9 F (36.6 C)  Resp: 20     Body mass index is 20.78 kg/(m^2).    ECOG FS: 0 Filed Weights   06/05/12 1458  Weight: 128 lb 11.2 oz (58.378 kg)    Sclerae unicteric Oropharynx clear, buccal mucosa is pink and moist No cervical or supraclavicular adenopathy Lungs clear to auscultation bilaterally, no wheezes, rales or rhonchi Heart regular rate and rhythm Abdomen soft, nontender to palpation, positive bowel sounds MSK no focal spinal tenderness to palpation, no peripheral edema Neuro: nonfocal, well oriented with positive affect Breasts: Right breast is unremarkable. Left breast is status post lumpectomy with no suspicious nodularities or evidence of local recurrence. Axillae are benign bilaterally the palpable adenopathy noted.   LAB RESULTS: Lab Results  Component Value Date   WBC 6.0 06/05/2012   NEUTROABS 3.6 06/05/2012   HGB 12.7 06/05/2012   HCT 37.5 06/05/2012   MCV 104.5* 06/05/2012   PLT 202 06/05/2012      Chemistry      Component Value Date/Time   NA 142 06/05/2012 1442   NA 144 05/30/2011 0837   K 4.1 06/05/2012 1442   K 3.7 05/30/2011 0837   CL 108* 06/05/2012 1442   CL 108 05/30/2011 0837   CO2 27 06/05/2012 1442   CO2 28 05/30/2011 0837   BUN 14.3 06/05/2012 1442   BUN 14 05/30/2011 0837   CREATININE 0.7 06/05/2012 1442   CREATININE 0.64 05/30/2011 0837      Component Value Date/Time   CALCIUM 8.7 06/05/2012 1442   CALCIUM 8.3* 05/30/2011 0837   ALKPHOS 50 06/05/2012 1442   ALKPHOS 37* 05/30/2011 0837   AST 24 06/05/2012 1442   AST 20 05/30/2011 0837   ALT 15 06/05/2012 1442   ALT 14  05/30/2011 0837   BILITOT 0.53 06/05/2012 1442   BILITOT 0.9 05/30/2011 0837       Lab Results  Component Value Date   LABCA2 14 05/30/2011     STUDIES:  Most recent bone density at [facility] in May 2012 showed osteoporosis.  [facility] was unremarkable.   ASSESSMENT: 72 y.o.  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   (2)  Status post radiation therapy.   (3)  On exemestane from April of 2005 until April of 2010 per the MA27 study.   (4)  Began on tamoxifen 20 mg daily in July of  2010.    PLAN: Caroline Harper is doing extremely well clinically with regards to her breast cancer, and is tolerating the tamoxifen with no complaints whatsoever. At this point, we will make no changes in our plan. She will continue on tamoxifen for at least another year and will see Dr. Darnelle Catalan in April 2015. At that time, they will discuss whether  to continue or discontinue tamoxifen.    In the meanwhile, we will also repeat her bone density, and she'll be due for her next annual mammogram in December 2014.  All this was reviewed with Caroline Harper today and she voices understanding and agreement with our plan. She knows to call with any changes or problems.  Caroline Harper    06/05/2012

## 2012-06-09 ENCOUNTER — Ambulatory Visit: Payer: Medicare Other | Admitting: Physician Assistant

## 2012-12-30 ENCOUNTER — Telehealth: Payer: Self-pay | Admitting: *Deleted

## 2012-12-30 NOTE — Telephone Encounter (Signed)
Lm informed the pt that [facility] will be in [facility] ON 05/27/13 during the AM. gv appt for 05/27/13 @ 1pm. Pt is aware that i will mail a letter/avs...td

## 2013-01-23 ENCOUNTER — Ambulatory Visit
Admission: RE | Admit: 2013-01-23 | Discharge: 2013-01-23 | Disposition: A | Discharge status: Home / Self Care | Payer: Medicare PPO | Source: Ambulatory Visit | Attending: Physician Assistant | Admitting: Physician Assistant

## 2013-01-23 DIAGNOSIS — Z853 Personal history of malignant neoplasm of breast: Secondary | ICD-10-CM

## 2013-01-23 DIAGNOSIS — Z780 Asymptomatic menopausal state: Secondary | ICD-10-CM

## 2013-01-23 DIAGNOSIS — M810 Age-related osteoporosis without current pathological fracture: Secondary | ICD-10-CM

## 2013-01-23 DIAGNOSIS — C50912 Malignant neoplasm of unspecified site of left female breast: Secondary | ICD-10-CM

## 2013-02-23 ENCOUNTER — Other Ambulatory Visit: Payer: Self-pay | Admitting: Dermatology

## 2013-03-16 ENCOUNTER — Other Ambulatory Visit: Payer: Self-pay | Admitting: Gastroenterology

## 2013-04-28 ENCOUNTER — Encounter (HOSPITAL_COMMUNITY): Payer: Self-pay | Admitting: Pharmacy Technician

## 2013-04-28 ENCOUNTER — Encounter (HOSPITAL_COMMUNITY): Payer: Self-pay | Admitting: *Deleted

## 2013-04-28 DIAGNOSIS — C801 Malignant (primary) neoplasm, unspecified: Secondary | ICD-10-CM

## 2013-04-28 HISTORY — PX: CHOLECYSTECTOMY: SHX55

## 2013-04-28 HISTORY — DX: Malignant (primary) neoplasm, unspecified: C80.1

## 2013-05-04 ENCOUNTER — Telehealth: Payer: Self-pay | Admitting: Oncology

## 2013-05-04 NOTE — Telephone Encounter (Signed)
pt called to r/s lab...done...pt aware of new d.t °

## 2013-05-06 ENCOUNTER — Ambulatory Visit (HOSPITAL_BASED_OUTPATIENT_CLINIC_OR_DEPARTMENT_OTHER): Payer: Medicare PPO

## 2013-05-06 DIAGNOSIS — Z853 Personal history of malignant neoplasm of breast: Secondary | ICD-10-CM

## 2013-05-06 DIAGNOSIS — M810 Age-related osteoporosis without current pathological fracture: Secondary | ICD-10-CM

## 2013-05-06 DIAGNOSIS — C50419 Malignant neoplasm of upper-outer quadrant of unspecified female breast: Secondary | ICD-10-CM

## 2013-05-06 DIAGNOSIS — Z780 Asymptomatic menopausal state: Secondary | ICD-10-CM

## 2013-05-06 DIAGNOSIS — C50912 Malignant neoplasm of unspecified site of left female breast: Secondary | ICD-10-CM

## 2013-05-06 LAB — CBC WITH DIFFERENTIAL/PLATELET
BASO%: 0.6 % (ref 0.0–2.0)
Basophils Absolute: 0 10*3/uL (ref 0.0–0.1)
EOS ABS: 0 10*3/uL (ref 0.0–0.5)
EOS%: 0.5 % (ref 0.0–7.0)
HEMATOCRIT: 38.4 % (ref 34.8–46.6)
HEMOGLOBIN: 12.9 g/dL (ref 11.6–15.9)
LYMPH%: 31.5 % (ref 14.0–49.7)
MCH: 35.3 pg — ABNORMAL HIGH (ref 25.1–34.0)
MCHC: 33.5 g/dL (ref 31.5–36.0)
MCV: 105.4 fL — ABNORMAL HIGH (ref 79.5–101.0)
MONO#: 0.5 10*3/uL (ref 0.1–0.9)
MONO%: 8.9 % (ref 0.0–14.0)
NEUT%: 58.5 % (ref 38.4–76.8)
NEUTROS ABS: 3.1 10*3/uL (ref 1.5–6.5)
Platelets: 201 10*3/uL (ref 145–400)
RBC: 3.64 10*6/uL — ABNORMAL LOW (ref 3.70–5.45)
RDW: 12.7 % (ref 11.2–14.5)
WBC: 5.4 10*3/uL (ref 3.9–10.3)
lymph#: 1.7 10*3/uL (ref 0.9–3.3)

## 2013-05-06 LAB — COMPREHENSIVE METABOLIC PANEL (CC13)
ALBUMIN: 4.2 g/dL (ref 3.5–5.0)
ALT: 17 U/L (ref 0–55)
AST: 24 U/L (ref 5–34)
Alkaline Phosphatase: 41 U/L (ref 40–150)
Anion Gap: 12 mEq/L — ABNORMAL HIGH (ref 3–11)
BUN: 12.6 mg/dL (ref 7.0–26.0)
CALCIUM: 9.3 mg/dL (ref 8.4–10.4)
CHLORIDE: 105 meq/L (ref 98–109)
CO2: 25 mEq/L (ref 22–29)
CREATININE: 0.7 mg/dL (ref 0.6–1.1)
GLUCOSE: 92 mg/dL (ref 70–140)
POTASSIUM: 4 meq/L (ref 3.5–5.1)
Sodium: 142 mEq/L (ref 136–145)
Total Bilirubin: 1.03 mg/dL (ref 0.20–1.20)
Total Protein: 7.1 g/dL (ref 6.4–8.3)

## 2013-05-12 ENCOUNTER — Ambulatory Visit (HOSPITAL_COMMUNITY)
Admission: RE | Admit: 2013-05-12 | Discharge: 2013-05-12 | Disposition: A | Discharge status: Home / Self Care | Payer: Medicare PPO | Source: Ambulatory Visit | Attending: Gastroenterology | Admitting: Gastroenterology

## 2013-05-12 ENCOUNTER — Encounter (HOSPITAL_COMMUNITY): Payer: Medicare PPO | Admitting: Anesthesiology

## 2013-05-12 ENCOUNTER — Encounter (HOSPITAL_COMMUNITY): Payer: Self-pay

## 2013-05-12 ENCOUNTER — Ambulatory Visit (HOSPITAL_COMMUNITY): Payer: Medicare PPO | Admitting: Anesthesiology

## 2013-05-12 ENCOUNTER — Encounter (HOSPITAL_COMMUNITY)
Admission: RE | Disposition: A | Discharge status: Home / Self Care | Payer: Self-pay | Source: Ambulatory Visit | Attending: Gastroenterology

## 2013-05-12 DIAGNOSIS — K5730 Diverticulosis of large intestine without perforation or abscess without bleeding: Secondary | ICD-10-CM | POA: Insufficient documentation

## 2013-05-12 DIAGNOSIS — Z853 Personal history of malignant neoplasm of breast: Secondary | ICD-10-CM | POA: Insufficient documentation

## 2013-05-12 DIAGNOSIS — Z1211 Encounter for screening for malignant neoplasm of colon: Secondary | ICD-10-CM | POA: Insufficient documentation

## 2013-05-12 DIAGNOSIS — Z9010 Acquired absence of unspecified breast and nipple: Secondary | ICD-10-CM | POA: Insufficient documentation

## 2013-05-12 DIAGNOSIS — M810 Age-related osteoporosis without current pathological fracture: Secondary | ICD-10-CM | POA: Insufficient documentation

## 2013-05-12 DIAGNOSIS — Z9221 Personal history of antineoplastic chemotherapy: Secondary | ICD-10-CM | POA: Insufficient documentation

## 2013-05-12 HISTORY — PX: COLONOSCOPY WITH PROPOFOL: SHX5780

## 2013-05-12 HISTORY — DX: Malignant (primary) neoplasm, unspecified: C80.1

## 2013-05-12 HISTORY — DX: Personal history of urinary calculi: Z87.442

## 2013-05-12 SURGERY — COLONOSCOPY WITH PROPOFOL
Anesthesia: Monitor Anesthesia Care

## 2013-05-12 MED ORDER — PROPOFOL 10 MG/ML IV BOLUS
INTRAVENOUS | Status: AC
  Filled 2013-05-12: qty 20

## 2013-05-12 MED ORDER — LIDOCAINE HCL (CARDIAC) 20 MG/ML IV SOLN
INTRAVENOUS | Status: AC
  Filled 2013-05-12: qty 5

## 2013-05-12 MED ORDER — LACTATED RINGERS IV SOLN
INTRAVENOUS | Status: DC | PRN
  Administered 2013-05-12: 08:00:00 via INTRAVENOUS

## 2013-05-12 MED ORDER — LIDOCAINE HCL (CARDIAC) 20 MG/ML IV SOLN
INTRAVENOUS | Status: DC | PRN
  Administered 2013-05-12: 60 mg via INTRAVENOUS

## 2013-05-12 MED ORDER — LACTATED RINGERS IV SOLN
INTRAVENOUS | Status: DC
  Administered 2013-05-12: 1000 mL via INTRAVENOUS

## 2013-05-12 MED ORDER — SODIUM CHLORIDE 0.9 % IV SOLN
INTRAVENOUS | Status: DC
Start: 2013-05-12 — End: 2013-05-12

## 2013-05-12 MED ORDER — PROPOFOL INFUSION 10 MG/ML OPTIME
INTRAVENOUS | Status: DC | PRN
  Administered 2013-05-12: 200 ug/kg/min via INTRAVENOUS

## 2013-05-12 SURGICAL SUPPLY — 21 items

## 2013-05-12 NOTE — Op Note (Signed)
Procedure: Screening colonoscopy. November 2005 normal flexible proctosigmoidoscopy followed by air contrast barium enema.  Endoscopist: Johnson  Premedication: Propofol administered by anesthesia  Procedure: The patient was placed in the left lateral decubitus position. Anal inspection and digital rectal exam were normal. The Pentax pediatric colonoscope was introduced into the rectum and advanced to the cecum. A normal-appearing appendiceal orifice and ileocecal valve were identified. Colonic preparation for the exam today was good.  Rectum. Normal. Retroflexed view of the distal rectum normal  Sigmoid colon and descending colon. Extensive left colonic diverticulosis  Splenic flexure. Normal  Transverse colon. Normal  Hepatic flexure. Normal  Ascending colon. Normal  Cecum and ileocecal valve. Normal  Assessment: Normal screening proctocolonoscopy to the cecum. Extensive left colonic diverticulosis

## 2013-05-12 NOTE — Transfer of Care (Signed)
Immediate Anesthesia Transfer of Care Note  Patient: Caroline Harper  Procedure(s) Performed: Procedure(s): COLONOSCOPY WITH PROPOFOL (N/A)  Patient Location: PACU  Anesthesia Type:MAC  Level of Consciousness: awake and alert   Airway & Oxygen Therapy: Patient connected to face mask oxygen  Post-op Assessment: Report given to PACU RN and Post -op Vital signs reviewed and stable  Post vital signs: Reviewed and stable  Complications: No apparent anesthesia complications

## 2013-05-12 NOTE — H&P (Signed)
  Procedure: Screening colonoscopy  History: The patient is a 73 year old female born [birth year]. She is scheduled to undergo a screening colonoscopy today.  In November 2005, she underwent a normal incomplete proctocolonoscopy performed to 50 cm from the anal verge followed by a normal air contrast barium enema.  Past medical history: Left breast lumpectomy to treat infiltrating ductal carcinoma of the left breast in 2005. Osteoporosis. Right knee surgery.  Medication allergies: None  Habits: The patient does not smoke cigarettes. She consumes alcohol in moderation.  Exam: The patient is alert and lying comfortably on the endoscopy stretcher. Abdomen is soft and nontender to palpation. Lungs are clear to auscultation. Cardiac exam reveals a regular rhythm.  Plan: Proceed with screening colonoscopy

## 2013-05-12 NOTE — Anesthesia Preprocedure Evaluation (Signed)
Anesthesia Evaluation  Patient identified by MRN, date of birth, ID band Patient awake    Reviewed: Allergy & Precautions, H&P , NPO status , Patient's Chart, lab work & pertinent test results  Airway Mallampati: II TM Distance: >3 FB Neck ROM: Full    Dental  (+) Teeth Intact, Dental Advisory Given   Pulmonary neg pulmonary ROS,  breath sounds clear to auscultation  Pulmonary exam normal       Cardiovascular negative cardio ROS  Rhythm:Regular Rate:Normal     Neuro/Psych negative neurological ROS  negative psych ROS   GI/Hepatic negative GI ROS, Neg liver ROS,   Endo/Other  negative endocrine ROS  Renal/GU negative Renal ROS  negative genitourinary   Musculoskeletal negative musculoskeletal ROS (+)   Abdominal   Peds  Hematology negative hematology ROS (+)   Anesthesia Other Findings History of left breast CA; s/p lumpectomy/chemo.  Reproductive/Obstetrics                           Anesthesia Physical Anesthesia Plan  ASA: II  Anesthesia Plan: MAC   Post-op Pain Management:    Induction: Intravenous  Airway Management Planned: Simple Face Mask  Additional Equipment:   Intra-op Plan:   Post-operative Plan:   Informed Consent: I have reviewed the patients History and Physical, chart, labs and discussed the procedure including the risks, benefits and alternatives for the proposed anesthesia with the patient or authorized representative who has indicated his/her understanding and acceptance.   Dental advisory given  Plan Discussed with: CRNA  Anesthesia Plan Comments:         Anesthesia Quick Evaluation

## 2013-05-12 NOTE — Anesthesia Postprocedure Evaluation (Signed)
Anesthesia Post Note  Patient: Caroline Harper  Procedure(s) Performed: Procedure(s) (LRB): COLONOSCOPY WITH PROPOFOL (N/A)  Anesthesia type: MAC  Patient location: PACU  Post pain: Pain level controlled  Post assessment: Post-op Vital signs reviewed  Last Vitals:  Filed Vitals:   05/12/13 0909  BP: 136/90  Pulse:   Temp:   Resp: 13    Post vital signs: Reviewed  Level of consciousness: sedated  Complications: No apparent anesthesia complications

## 2013-05-12 NOTE — Discharge Instructions (Signed)
Monitored Anesthesia Care  Monitored anesthesia care is an anesthesia service for a medical procedure. Anesthesia is the loss of the ability to feel pain. It is produced by medications called anesthetics. It may affect a small area of your body (local anesthesia), a large area of your body (regional anesthesia), or your entire body (general anesthesia). The need for monitored anesthesia care depends your procedure, your condition, and the potential need for regional or general anesthesia. It is often provided during procedures where:   General anesthesia may be needed if there are complications. This is because you need special care when you are under general anesthesia.   You will be under local or regional anesthesia. This is so that you are able to have higher levels of anesthesia if needed.   You will receive calming medications (sedatives). This is especially the case if sedatives are given to put you in a semi-conscious state of relaxation (deep sedation). This is because the amount of sedative needed to produce this state can be hard to predict. Too much of a sedative can produce general anesthesia. Monitored anesthesia care is performed by one or more caregivers who have special training in all types of anesthesia. You will need to meet with these caregivers before your procedure. During this meeting, they will ask you about your medical history. They will also give you instructions to follow. (For example, you will need to stop eating and drinking before your procedure. You may also need to stop or change medications you are taking.) During your procedure, your caregivers will stay with you. They will:   Watch your condition. This includes watching you blood pressure, breathing, and level of pain.   Diagnose and treat problems that occur.   Give medications if they are needed. These may include calming medications (sedatives) and anesthetics.   Make sure you are comfortable.  Having  monitored anesthesia care does not necessarily mean that you will be under anesthesia. It does mean that your caregivers will be able to manage anesthesia if you need it or if it occurs. It also means that you will be able to have a different type of anesthesia than you are having if you need it. When your procedure is complete, your caregivers will continue to watch your condition. They will make sure any medications wear off before you are allowed to go home.  Document Released: 10/25/2004 Document Revised: 05/26/2012 Document Reviewed: 03/12/2012 [facility] Patient Information 2014 [place], [place]. Colonoscopy A colonoscopy is an exam to look at your colon. This exam helps to find lumps (tumors), growths (polyps), puffiness (swelling), and bleeding in your colon.  BEFORE THE PROCEDURE  You may need to drink clear liquids for 2 days before the exam.  Ask your doctor about changing or stopping your regular medicines.  You may need liquid or medicines put in your butt (enema or laxatives) to help you poop.  You may need to drink a liquid over a short amount of time. This liquid cleans your colon.  Ask your doctor what time you need to arrive. PROCEDURE A tube is put in the opening of your butt (anus) and into your colon. The doctor will look for anything that is not normal. Your doctor may take a tissue sample (biopsy) from your colon to be looked at more closely. AFTER THE PROCEDURE  Have someone drive you home if you took pain medicine or a medicine to relax you (sedative).  You may see blood in your poop (stool). This is  normal.  You may pass gas and have belly (abdominal) cramps. This is normal. Finding out the results of your test Ask when your test results will be ready. Make sure you get your test results. Document Released: 03/03/2010 Document Revised: 05/26/2012 Document Reviewed: 10/06/2012 [facility] Patient Information 2014 [place].

## 2013-05-13 ENCOUNTER — Encounter (HOSPITAL_COMMUNITY): Payer: Self-pay | Admitting: Gastroenterology

## 2013-05-22 ENCOUNTER — Other Ambulatory Visit: Payer: Medicare PPO

## 2013-05-27 ENCOUNTER — Ambulatory Visit (HOSPITAL_BASED_OUTPATIENT_CLINIC_OR_DEPARTMENT_OTHER): Payer: Medicare PPO | Admitting: Oncology

## 2013-05-27 ENCOUNTER — Ambulatory Visit: Payer: Medicare PPO | Admitting: Oncology

## 2013-05-27 VITALS — BP 128/76 | HR 86 | Temp 98.4°F | Resp 18 | Ht 66.0 in | Wt 130.2 lb

## 2013-05-27 DIAGNOSIS — Z853 Personal history of malignant neoplasm of breast: Secondary | ICD-10-CM

## 2013-05-27 DIAGNOSIS — C50919 Malignant neoplasm of unspecified site of unspecified female breast: Secondary | ICD-10-CM

## 2013-05-27 DIAGNOSIS — M810 Age-related osteoporosis without current pathological fracture: Secondary | ICD-10-CM

## 2013-05-27 NOTE — Progress Notes (Signed)
ID: Caroline Harper   DOB: 08/15/1940  MR#: [id number]  LNL#:[id number]  PCP: Mathews Argyle, MD GYN:  SU:  OTHER MD:  Earle Gell M.D.   HISTORY OF PRESENT ILLNESS: Caroline Harper had her routine mammogram at [facility] on December 31, 2002 showing an irregularity in the left breast. The right breast was unremarkable. Spot compression and additional views showed an irregular mass at the 2 o'clock position, 1 cm from the left nipple (December 2nd). Ultrasound core biopsy was obtained on the same day and showed ([id number]) an invasive mammary carcinoma that was ER/PR positive, HER-2/neu negative.   With this information, the patient was referred to Dr. Marylene Buerger. After appropriate discussion, she was referred for left lumpectomy and axillary lymph node biopsy February 15, 2003. The final pathology there ([id number]) showed a 2.2 cm Grade 1 infiltrating ductal carcinoma with negative margins. No evidence of lymphovascular invasion, but a micrometastasis in a single lymph node examined. Her subsequent history is as detailed below    INTERVAL HISTORY: Caroline Harper returns today for one-year followup of her left breast carcinoma. She is tolerating her tamoxifen without any unusual side effects and in particular with no hot flashes or vaginal discharge. She is now ready to "graduate" from followup here.  REVIEW OF SYSTEMS: [place] continues to travel and to exercise regularly. She has had some hoarseness for the last year. This has been evaluated by ENT with no specific pathology noted. She has also had a change in her bowel habits. They were firm and once a day but the last few months have been 2-3 times in the morning and a bit looser. She just had a colonoscopy under Dr. Durenda Age which was unremarkable. However she tells me she has been doing a lot of "sugarless gum" which of course can take sorbitol. That likely is the culprit.   A detailed review of systems today was otherwise noncontributory  PAST MEDICAL  HISTORY: Past Medical History  Diagnosis Date  . Osteoporosis, unspecified 06/05/2012  . History of kidney stones     x1   . Cancer 04-28-13    '04-left Breast-lumpectomy,radiation ,oral med.-Dr. Jana Hakim follows.    PAST SURGICAL HISTORY: Past Surgical History  Procedure Laterality Date  . Back surgery      Complete spinal fusion(hx. scoliosis)  . Tubal ligation    . Breast surgery Left     '04-lumpectomy.  . Colonoscopy      past hx, x1 - no significant findings  . Cholecystectomy  04-28-13    3'08- laparoscopic -  . Colonoscopy with propofol N/A 05/12/2013    Procedure: COLONOSCOPY WITH PROPOFOL;  Surgeon: Garlan Fair, MD;  Location: [facility] ENDOSCOPY;  Service: Endoscopy;  Laterality: N/A;   Significant for surgery for treatment of scoliosis as a teenager, and this required some spinal fusion and some correction of her left knee.  She is also status post bilateral tubal ligation.    FAMILY HISTORY No family history on file. The patient's father died at the age of 30 with vasculitis.  Patient's mother died at the age of 82 with congestive heart failure.  The patient has one brother and one sister. There is no history of cancer in the family.    GYNECOLOGIC HISTORY: She is G2 P2. She did take hormone replacement for a time.  SOCIAL HISTORY: She is a retired Proofreader. She has been married to her husband, Carlis Harper, for over 40 years.  He is retired from [employer]. Their son, Richardson Landry, in [place],  works for [employer]. Their daughter, Nira Conn, is in [place] and has two children.  She is married to an Animator. The Raneys are members of [employer].      ADVANCED DIRECTIVES: In Place.  Husband, Carlis Harper, is HCPOA.  HEALTH MAINTENANCE: History  Substance Use Topics  . Smoking status: Never Smoker   . Smokeless tobacco: Never Used  . Alcohol Use: 0.6 oz/week    1 Glasses of wine per week     Comment: wine daily at dinner     Colonoscopy:  PAP:  UTD  Bone density:  May 2012, osteoporosis  Lipid panel:  UTD  No Known Allergies  Current Outpatient Prescriptions  Medication Sig Dispense Refill  . calcium-vitamin D (OSCAL WITH D) 500-200 MG-UNIT per tablet Take 1 tablet by mouth 2 (two) times daily.      . fish oil-omega-3 fatty acids 1000 MG capsule Take 1 g by mouth daily.      . Multiple Vitamin (MULTIVITAMIN) tablet Take 1 tablet by mouth daily.      . tamoxifen (NOLVADEX) 20 MG tablet Take 20 mg by mouth every morning.      . vitamin C (ASCORBIC ACID) 500 MG tablet Take 500 mg by mouth daily.       No current facility-administered medications for this visit.    OBJECTIVE: Middle-aged Caucasian female who appears stated age  73 Vitals:   05/27/13 1324  BP: 128/76  Pulse: 86  Temp: 98.4 F (36.9 C)  Resp: 18     Body mass index is 21.02 kg/(m^2).    ECOG FS: 0 Filed Weights   05/27/13 1324  Weight: 130 lb 3.2 oz (59.058 kg)    Sclerae unicteric, pupils round and reactive  Oropharynx clear and moist  No cervical or supraclavicular adenopathy Lungs clear to auscultation bilaterally Heart regular rate and rhythm Abdomen soft, nontender, positive bowel sounds MSK no focal spinal tenderness no peripheral edema Neuro: nonfocal, well oriented, positive affect Breasts: Right breast is unremarkable Other than for some seborrheic keratoses.. Left breast is status post lumpectomy with no evidence of local recurrence.The left axilla is benign    LAB RESULTS: Lab Results  Component Value Date   WBC 5.4 05/06/2013   NEUTROABS 3.1 05/06/2013   HGB 12.9 05/06/2013   HCT 38.4 05/06/2013   MCV 105.4* 05/06/2013   PLT 201 05/06/2013      Chemistry      Component Value Date/Time   NA 142 05/06/2013 1140   NA 144 05/30/2011 0837   K 4.0 05/06/2013 1140   K 3.7 05/30/2011 0837   CL 108* 06/05/2012 1442   CL 108 05/30/2011 0837   CO2 25 05/06/2013 1140   CO2 28 05/30/2011 0837   BUN 12.6 05/06/2013 1140   BUN 14 05/30/2011 0837    CREATININE 0.7 05/06/2013 1140   CREATININE 0.64 05/30/2011 0837      Component Value Date/Time   CALCIUM 9.3 05/06/2013 1140   CALCIUM 8.3* 05/30/2011 0837   ALKPHOS 41 05/06/2013 1140   ALKPHOS 37* 05/30/2011 0837   AST 24 05/06/2013 1140   AST 20 05/30/2011 0837   ALT 17 05/06/2013 1140   ALT 14 05/30/2011 0837   BILITOT 1.03 05/06/2013 1140   BILITOT 0.9 05/30/2011 0837       Lab Results  Component Value Date   LABCA2 14 05/30/2011     STUDIES: CLINICAL DATA: Screening. History of left lumpectomy for breast  cancer 2005.  EXAM:  DIGITAL SCREENING BILATERAL MAMMOGRAM WITH CAD  COMPARISON: Previous exam(s).  ACR Breast Density Category d: The breasts are extremely dense,  which lowers the sensitivity of mammography.  FINDINGS:  There are no findings suspicious for malignancy. Images were  processed with CAD. Left lumpectomy changes are reidentified.  IMPRESSION:  No mammographic evidence of malignancy. A result letter of this  screening mammogram will be mailed directly to the patient.  RECOMMENDATION:  Screening mammogram in one year. (Code:SM-B-01Y)  BI-RADS CATEGORY 1: Negative  Electronically Signed  By: Conchita Paris M.D.  On: 01/27/2013 10:58  Most recent bone density at [facility] in May 2014 showed osteoporosis with a Tscore of -3.0.   ASSESSMENT: 73 y.o.  [place] woman   (1)  status post left lumpectomy and sentinel lymph node biopsy in January of 2005 for a T2 N1, stage IIB invasive ductal carcinoma, Grade 1, ER/PR positive and HER2/neu negative.   (2)  Status post radiation therapy.   (3)  On exemestane from April of 2005 until April of 2010 per the MA27 study.   (4)  On tamoxifen July of 2010 to April 2015.  PLAN: She has completed 10 years of followup and antiestrogen therapy and I am very comfortable releasing her to her primary care physician at this point. All she needs as far as breast cancer followup is concerned with a yearly mammogram and yearly  physician breast exam.  She knows we keep her records about 10 years and so if there is any question or concerns she can call or schedule a visit. As of now however no routine return appointments are being made for her here.Virgie Dad Magrinat    05/27/2013

## 2013-05-28 ENCOUNTER — Telehealth: Payer: Self-pay | Admitting: Oncology

## 2013-05-28 ENCOUNTER — Telehealth: Payer: Self-pay | Admitting: *Deleted

## 2013-05-28 NOTE — Telephone Encounter (Signed)
Sent letter to Dr. Christiane Ha T. [place]akuli office from Dr. Jana Hakim

## 2013-05-28 NOTE — Telephone Encounter (Signed)
Message left by patient stating she was seen yesterday and " released from care, but I do not know if I need to continue on the tamoxifen ?"  Pt left return call number as [phone].  This RN returned call and obtained identified VM- message left informing pt she has completed tamoxifen therapy and may return call to this RN if additional questions.

## 2013-10-01 ENCOUNTER — Other Ambulatory Visit: Payer: Self-pay | Admitting: Obstetrics and Gynecology

## 2013-10-02 ENCOUNTER — Encounter (HOSPITAL_COMMUNITY): Payer: Self-pay

## 2013-10-02 ENCOUNTER — Encounter (HOSPITAL_COMMUNITY)
Admission: RE | Admit: 2013-10-02 | Discharge: 2013-10-02 | Disposition: A | Discharge status: Home / Self Care | Payer: Medicare PPO | Source: Ambulatory Visit | Attending: Obstetrics and Gynecology | Admitting: Obstetrics and Gynecology

## 2013-10-02 DIAGNOSIS — N950 Postmenopausal bleeding: Secondary | ICD-10-CM | POA: Diagnosis present

## 2013-10-02 DIAGNOSIS — N840 Polyp of corpus uteri: Secondary | ICD-10-CM | POA: Diagnosis not present

## 2013-10-02 LAB — COMPREHENSIVE METABOLIC PANEL
ALT: 17 U/L (ref 0–35)
ANION GAP: 10 (ref 5–15)
AST: 27 U/L (ref 0–37)
Albumin: 4 g/dL (ref 3.5–5.2)
Alkaline Phosphatase: 51 U/L (ref 39–117)
BUN: 16 mg/dL (ref 6–23)
CO2: 30 mEq/L (ref 19–32)
Calcium: 8.7 mg/dL (ref 8.4–10.5)
Chloride: 102 mEq/L (ref 96–112)
Creatinine, Ser: 0.65 mg/dL (ref 0.50–1.10)
GFR calc non Af Amer: 87 mL/min — ABNORMAL LOW (ref >90)
GLUCOSE: 151 mg/dL — AB (ref 70–99)
Potassium: 4 mEq/L (ref 3.7–5.3)
Sodium: 142 mEq/L (ref 137–147)
TOTAL PROTEIN: 6.9 g/dL (ref 6.0–8.3)
Total Bilirubin: 0.8 mg/dL (ref 0.3–1.2)

## 2013-10-02 LAB — CBC WITH DIFFERENTIAL/PLATELET
Basophils Absolute: 0 10*3/uL (ref 0.0–0.1)
Basophils Relative: 1 % (ref 0–1)
Eosinophils Absolute: 0 10*3/uL (ref 0.0–0.7)
Eosinophils Relative: 1 % (ref 0–5)
HCT: 38.1 % (ref 36.0–46.0)
HEMOGLOBIN: 12.9 g/dL (ref 12.0–15.0)
LYMPHS ABS: 2.1 10*3/uL (ref 0.7–4.0)
LYMPHS PCT: 38 % (ref 12–46)
MCH: 35.1 pg — ABNORMAL HIGH (ref 26.0–34.0)
MCHC: 33.9 g/dL (ref 30.0–36.0)
MCV: 103.5 fL — ABNORMAL HIGH (ref 78.0–100.0)
MONOS PCT: 9 % (ref 3–12)
Monocytes Absolute: 0.5 10*3/uL (ref 0.1–1.0)
NEUTROS ABS: 2.9 10*3/uL (ref 1.7–7.7)
Neutrophils Relative %: 51 % (ref 43–77)
Platelets: 185 10*3/uL (ref 150–400)
RBC: 3.68 MIL/uL — ABNORMAL LOW (ref 3.87–5.11)
RDW: 12.7 % (ref 11.5–15.5)
WBC: 5.5 10*3/uL (ref 4.0–10.5)

## 2013-10-02 NOTE — H&P (Signed)
Caroline Harper, Caroline Harper                 ACCOUNT NO.:  [id number]  MEDICAL RECORD NO.:  [id number]  LOCATION:  [facility]                         FACILITY:  [facility]  PHYSICIAN:  Lucille Passy. Ulanda Edison, M.D. DATE OF BIRTH:  February 09, 1941  DATE OF ADMISSION:  10/05/2013 DATE OF DISCHARGE:                             HISTORY & PHYSICAL   PRESENT ILLNESS:  This is a 73 year old white married female, para 2-0-0- 2, who was admitted for D and C, hysteroscopy because of persistent postmenopausal bleeding lasting 6 weeks and endometrial biopsy suggesting a polyp with endometrial thickness of 11 mm.  This patient saw bleeding on her sheets on August 21, 2013.  She was seen by Dr. Felipa Eth who treated her for urinary tract infection.  She then saw Dr. Gaynelle Arabian, who did a CT scan, which showed a thickened endometrium and densities in the adnexa and also showed a kidney stone.  Ultrasound in our office showed bilateral echo free cyst in the adnexa and endometrium of 11+ mm.  She had continued to spot daily since August 21, 2013, she underwent an endometrial biopsy which showed an endometrioid-type polyps with features of infarct.  No evidence of hyperplasia or malignancy. Because of the probable presence of a polyp and the continued bleeding for 6 weeks, she is admitted for D and C and hysteroscopy to see if we can remove the tissue from the uterine cavity that is bleeding.  PAST MEDICAL HISTORY:  Allergies:  No known drug allergies. Operations:  Knee surgery in 1957, back surgery in 58, tubal ligation in 1978, lumpectomy in 2005, for breast cancer; cholecystectomy in April 2009.  SOCIAL HISTORY:  Never smoked.  Moderate alcohol intake.  She is retired and married.  FAMILY HISTORY:  Sister with high blood pressure and brother with high blood pressure.  The patient underwent menopause at age 59.  She was treated with tamoxifen for years, but stopped the tamoxifen within the last 6 months.  MEDICATIONS:  She is not  on any significant medications at the present time.  PHYSICAL EXAMINATION:  GENERAL:  A well developed, well-nourished white female, in no distress.  She does have a significant back deformity. VITAL SIGNS:  Blood pressure is 128/76, height is 5 feet 5 inches, weight 126, BMI 21. HEAD, EYES, NOSE, AND THROAT:  Normal. NECK:  Supple without thyromegaly. LUNGS:  Clear to auscultation. HEART:  Normal size and sounds.  No murmurs. ABDOMEN:  Soft, and flat.  No masses are palpable. GENITALIA:  On recent exam, the vulva and vagina show a slight amount of blood.  The cervix has blood present.  The uterus is small. Adnexa are free of masses.  ADMITTING IMPRESSION:  Persistent postmenopausal bleeding with presence of probable polyp on endometrial biopsy and a thickened endometrium of 11+ mm.  She is admitted for D and C, hysteroscopy.  The risks of the surgery, potential complications have been explained in detail.     Lucille Passy. Ulanda Edison, M.D.     TFH/MEDQ  D:  10/02/2013  T:  10/02/2013  Job:  [id number]

## 2013-10-02 NOTE — Pre-Procedure Instructions (Signed)
No IVs, Labs, BPs from left arm.

## 2013-10-02 NOTE — Patient Instructions (Signed)
[place]  10/02/2013   Your procedure is scheduled on:  10/05/13  Enter through the Main Entrance of [facility] up the phone at the desk and dial [phone].   Call this number if you have problems the morning of surgery: [phone]   Remember:   Do not eat food:After Midnight.  Do not drink clear liquids: After Midnight.  Take these medicines the morning of surgery with A SIP OF WATER: NA   Do not wear jewelry, make-up or nail polish.  Do not wear lotions, powders, or perfumes. You may wear deodorant.  Do not shave 48 hours prior to surgery.  Do not bring valuables to the hospital.  [facility] is not   responsible for any belongings or valuables brought to the hospital.  Contacts, dentures or bridgework may not be worn into surgery.  Leave suitcase in the car. After surgery it may be brought to your room.  For patients admitted to the hospital, checkout time is 11:00 AM the day of              discharge.   Patients discharged the day of surgery will not be allowed to drive             home.  Name and phone number of your driver: NA  Special Instructions:      Please read over the following fact sheets that you were given:   Surgical Site Infection Prevention

## 2013-10-02 NOTE — Pre-Procedure Instructions (Signed)
EKG reviewed and accepted by Lyndle Herrlich, MD.

## 2013-10-05 ENCOUNTER — Ambulatory Visit (HOSPITAL_COMMUNITY): Payer: Medicare PPO | Admitting: Anesthesiology

## 2013-10-05 ENCOUNTER — Encounter (HOSPITAL_COMMUNITY): Payer: Self-pay

## 2013-10-05 ENCOUNTER — Ambulatory Visit (HOSPITAL_COMMUNITY)
Admission: RE | Admit: 2013-10-05 | Discharge: 2013-10-05 | Disposition: A | Discharge status: Home / Self Care | Payer: Medicare PPO | Source: Ambulatory Visit | Attending: Obstetrics and Gynecology | Admitting: Obstetrics and Gynecology

## 2013-10-05 ENCOUNTER — Encounter (HOSPITAL_COMMUNITY)
Admission: RE | Disposition: A | Discharge status: Home / Self Care | Payer: Self-pay | Source: Ambulatory Visit | Attending: Obstetrics and Gynecology

## 2013-10-05 ENCOUNTER — Encounter (HOSPITAL_COMMUNITY): Payer: Medicare PPO | Admitting: Anesthesiology

## 2013-10-05 DIAGNOSIS — N840 Polyp of corpus uteri: Secondary | ICD-10-CM | POA: Diagnosis not present

## 2013-10-05 DIAGNOSIS — N950 Postmenopausal bleeding: Secondary | ICD-10-CM | POA: Diagnosis not present

## 2013-10-05 HISTORY — PX: POLYPECTOMY: SHX5525

## 2013-10-05 HISTORY — PX: HYSTEROSCOPY W/D&C: SHX1775

## 2013-10-05 LAB — URINALYSIS, ROUTINE W REFLEX MICROSCOPIC
Bilirubin Urine: NEGATIVE
Glucose, UA: NEGATIVE mg/dL
Ketones, ur: NEGATIVE mg/dL
Nitrite: NEGATIVE
Protein, ur: NEGATIVE mg/dL
SPECIFIC GRAVITY, URINE: 1.02 (ref 1.005–1.030)
UROBILINOGEN UA: 0.2 mg/dL (ref 0.0–1.0)
pH: 6 (ref 5.0–8.0)

## 2013-10-05 LAB — URINE MICROSCOPIC-ADD ON

## 2013-10-05 SURGERY — DILATATION AND CURETTAGE /HYSTEROSCOPY
Anesthesia: General | Site: Vagina

## 2013-10-05 MED ORDER — ONDANSETRON HCL 4 MG/2ML IJ SOLN
INTRAMUSCULAR | Status: DC | PRN
  Administered 2013-10-05: 4 mg via INTRAVENOUS

## 2013-10-05 MED ORDER — MEPERIDINE HCL 25 MG/ML IJ SOLN
6.2500 mg | INTRAMUSCULAR | Status: DC | PRN
Start: 2013-10-05 — End: 2013-10-05

## 2013-10-05 MED ORDER — FENTANYL CITRATE 0.05 MG/ML IJ SOLN
INTRAMUSCULAR | Status: AC
  Filled 2013-10-05: qty 2

## 2013-10-05 MED ORDER — EPHEDRINE 5 MG/ML INJ
INTRAVENOUS | Status: AC
  Filled 2013-10-05: qty 10

## 2013-10-05 MED ORDER — LIDOCAINE HCL (CARDIAC) 20 MG/ML IV SOLN
INTRAVENOUS | Status: DC | PRN
  Administered 2013-10-05: 50 mg via INTRAVENOUS

## 2013-10-05 MED ORDER — DEXAMETHASONE SODIUM PHOSPHATE 4 MG/ML IJ SOLN
INTRAMUSCULAR | Status: AC
  Filled 2013-10-05: qty 1

## 2013-10-05 MED ORDER — IBUPROFEN 600 MG PO TABS: 600.0000 mg | ORAL_TABLET | Freq: Four times a day (QID) | ORAL | Status: DC | PRN

## 2013-10-05 MED ORDER — ONDANSETRON HCL 4 MG/2ML IJ SOLN
INTRAMUSCULAR | Status: AC
  Filled 2013-10-05: qty 2

## 2013-10-05 MED ORDER — OXYCODONE HCL 5 MG/5ML PO SOLN: 5.0000 mg | Freq: Once | ORAL | Status: DC | PRN

## 2013-10-05 MED ORDER — LIDOCAINE HCL (CARDIAC) 20 MG/ML IV SOLN
INTRAVENOUS | Status: AC
  Filled 2013-10-05: qty 5

## 2013-10-05 MED ORDER — EPHEDRINE SULFATE 50 MG/ML IJ SOLN
INTRAMUSCULAR | Status: DC | PRN
  Administered 2013-10-05: 10 mg via INTRAVENOUS

## 2013-10-05 MED ORDER — ONDANSETRON HCL 4 MG/2ML IJ SOLN: 4.0000 mg | Freq: Once | INTRAMUSCULAR | Status: DC | PRN

## 2013-10-05 MED ORDER — PROPOFOL 10 MG/ML IV BOLUS
INTRAVENOUS | Status: DC | PRN
  Administered 2013-10-05: 30 mg via INTRAVENOUS
  Administered 2013-10-05: 150 mg via INTRAVENOUS
  Administered 2013-10-05: 20 mg via INTRAVENOUS

## 2013-10-05 MED ORDER — FENTANYL CITRATE 0.05 MG/ML IJ SOLN
INTRAMUSCULAR | Status: DC | PRN
  Administered 2013-10-05 (×2): 50 ug via INTRAVENOUS

## 2013-10-05 MED ORDER — OXYCODONE HCL 5 MG PO TABS: 5.0000 mg | ORAL_TABLET | Freq: Once | ORAL | Status: DC | PRN

## 2013-10-05 MED ORDER — DEXAMETHASONE SODIUM PHOSPHATE 10 MG/ML IJ SOLN
INTRAMUSCULAR | Status: DC | PRN
  Administered 2013-10-05: 4 mg via INTRAVENOUS

## 2013-10-05 MED ORDER — LACTATED RINGERS IV SOLN
INTRAVENOUS | Status: DC
  Administered 2013-10-05: 100 mL/h via INTRAVENOUS
  Administered 2013-10-05: 08:00:00 via INTRAVENOUS

## 2013-10-05 MED ORDER — FENTANYL CITRATE 0.05 MG/ML IJ SOLN
25.0000 ug | INTRAMUSCULAR | Status: DC | PRN
  Administered 2013-10-05: 50 ug via INTRAVENOUS

## 2013-10-05 MED ORDER — PROPOFOL 10 MG/ML IV EMUL
INTRAVENOUS | Status: AC
  Filled 2013-10-05: qty 20

## 2013-10-05 SURGICAL SUPPLY — 15 items
CANISTER SUCT 3000ML (MISCELLANEOUS) ×3 IMPLANT
CATH ROBINSON RED A/P 16FR (CATHETERS) ×3 IMPLANT
CLOTH BEACON ORANGE TIMEOUT ST (SAFETY) ×3 IMPLANT
CONTAINER PREFILL 10% NBF 60ML (FORM) ×6 IMPLANT
DRAPE HYSTEROSCOPY (DRAPE) ×3 IMPLANT
ELECT REM PT RETURN 9FT ADLT (ELECTROSURGICAL)
ELECTRODE REM PT RTRN 9FT ADLT (ELECTROSURGICAL) IMPLANT
GLOVE BIO SURGEON STRL SZ7.5 (GLOVE) ×3 IMPLANT
GOWN STRL REUS W/TWL LRG LVL3 (GOWN DISPOSABLE) ×6 IMPLANT
PACK VAGINAL MINOR WOMEN LF (CUSTOM PROCEDURE TRAY) ×3 IMPLANT
PAD OB MATERNITY 4.3X12.25 (PERSONAL CARE ITEMS) ×3 IMPLANT
SET TUBING HYSTEROSCOPY 2 NDL (TUBING) IMPLANT
TOWEL OR 17X24 6PK STRL BLUE (TOWEL DISPOSABLE) ×6 IMPLANT
TUBE HYSTEROSCOPY W Y-CONNECT (TUBING) IMPLANT
WATER STERILE IRR 1000ML POUR (IV SOLUTION) ×3 IMPLANT

## 2013-10-05 NOTE — Transfer of Care (Signed)
Immediate Anesthesia Transfer of Care Note  Patient: Caroline Harper  Procedure(s) Performed: Procedure(s): DILATATION AND CURETTAGE /HYSTEROSCOPY (N/A) POLYPECTOMY (N/A)  Patient Location: PACU  Anesthesia Type:General  Level of Consciousness: awake  Airway & Oxygen Therapy: Patient Spontanous Breathing  Post-op Assessment: Report given to PACU RN  Post vital signs: stable  Filed Vitals:   10/05/13 0620  BP: 139/95  Temp: 36.9 C  Resp: 16    Complications: No apparent anesthesia complications

## 2013-10-05 NOTE — Progress Notes (Signed)
Patient ID: Caroline Harper, female   DOB: Jul 19, 1940, 73 y.o.   MRN: [id number] I examined this lady 4 days ago and she reports no change in her health since that time

## 2013-10-05 NOTE — Discharge Instructions (Signed)
Call with any unusual problems. Call with temp> 100.4 degrees, with severe pain, nausea or vomiting or any sx of concern.   DISCHARGE INSTRUCTIONS: D&C  The following instructions have been prepared to help you care for yourself upon your return home.   Personal hygiene:  Use sanitary pads for vaginal drainage, not tampons.  Shower the day after your procedure.  NO tub baths, pools or Jacuzzis for 2-3 weeks.  Wipe front to back after using the bathroom.  Activity and limitations:  Do NOT drive or operate any equipment for 24 hours. The effects of anesthesia are still present and drowsiness may result.  Do NOT rest in bed all day.  Walking is encouraged.  Walk up and down stairs slowly.  You may resume your normal activity in one to two days or as indicated by your physician.  Sexual activity: NO intercourse for at least 2 weeks after the procedure, or as indicated by your physician.  Diet: Eat a light meal as desired this evening. You may resume your usual diet tomorrow.  Return to work: You may resume your work activities in one to two days or as indicated by your doctor.  What to expect after your surgery: Expect to have vaginal bleeding/discharge for 2-3 days and spotting for up to 10 days. It is not unusual to have soreness for up to 1-2 weeks. You may have a slight burning sensation when you urinate for the first day. Mild cramps may continue for a couple of days. You may have a regular period in 2-6 weeks.  Call your doctor for any of the following:  Excessive vaginal bleeding, saturating and changing one pad every hour.  Inability to urinate 6 hours after discharge from hospital.  Pain not relieved by pain medication.  Fever of 100.4 F or greater.  Unusual vaginal discharge or odor.   Call for an appointment:    Patients signature: ______________________  Nurses signature ________________________  Support person's  signature_______________________

## 2013-10-05 NOTE — Op Note (Signed)
Caroline Harper, Caroline Harper                 ACCOUNT NO.:  [id number]  MEDICAL RECORD NO.:  [id number]  LOCATION:  [facility]                          FACILITY:  [facility]  PHYSICIAN:  Lucille Passy. Ulanda Edison, M.D. DATE OF BIRTH:  12/19/1940  DATE OF PROCEDURE:  10/05/2013 DATE OF DISCHARGE:                              OPERATIVE REPORT   PREOPERATIVE DIAGNOSIS:  Persistent postmenopausal bleeding with biopsy suggesting an infarcted endometrial polyp.  POSTOPERATIVE DIAGNOSIS:  Persistent postmenopausal bleeding with biopsy suggesting an infarcted endometrial polyp.  OPERATION:  Fractional dilation and curettage and removal of what was thought to be a polyp.  OPERATOR:  Lucille Passy. Ulanda Edison, MD  ANESTHESIA:  General, MAC.  DESCRIPTION OF PROCEDURE:  The patient was brought to the operating room and placed under satisfactory general anesthesia.  She was then placed in lithotomy position.  A time-out was done.  The vulva, vagina, and perineum were prepped with Betadine solution.  The urethra was prepped. The bladder was emptied with a [place] catheter.  Exam revealed the uterus to be slightly posterior, normal size.  The adnexa were free of masses.  Cervix was grasped with a tenaculum drawn into the operative field.  It was dilated to a 23 Pratt dilator.  The hysteroscope was introduced.  The cavity looked good, however, there was a blood colored structure hanging from the anterior surface of the uterus, but other than that, there were no cavity filling lesions identified.  Both tubal ostia which appeared completely normal.  The endocervical canal completely normal.  I used the polyp forceps to grab inside the endometrial cavity and there was a probably 8-10 mm blood colored solid structure that was removed that was thought to probably represent what remained of the polyp that by biopsy had been an infarcted polyp.  There was very little additional endometrial tissue.  I did do a fractional curettage curetting  the endocervix, then curetting the endometrial cavity.  Very little tissue was present on the surface of the endometrium.  So, at the end of the procedure,  the main structure that had been removed was an 8-10 mm solid structure that was blood colored, it was thought to probably represent the remains of an infarcted polyp. The deficit of fluid was 140 mL and some of that was on the floor.  The patient seemed to tolerate the procedure well and returned to recovery in satisfactory condition.  Blood loss probably 30 mL.     Lucille Passy. Ulanda Edison, M.D.    TFH/MEDQ  D:  10/05/2013  T:  10/05/2013  Job:  [id number]

## 2013-10-05 NOTE — Anesthesia Preprocedure Evaluation (Signed)
Anesthesia Evaluation  Patient identified by MRN, date of birth, ID band Patient awake    Reviewed: Allergy & Precautions, H&P , NPO status , Patient's Chart, lab work & pertinent test results, reviewed documented beta blocker date and time   Airway Mallampati: I TM Distance: >3 FB Neck ROM: full    Dental no notable dental hx. (+) Teeth Intact   Pulmonary neg pulmonary ROS,    Pulmonary exam normal       Cardiovascular negative cardio ROS      Neuro/Psych negative neurological ROS  negative psych ROS   GI/Hepatic negative GI ROS, Neg liver ROS,   Endo/Other  negative endocrine ROS  Renal/GU negative Renal ROS     Musculoskeletal   Abdominal Normal abdominal exam  (+)   Peds  Hematology negative hematology ROS (+)   Anesthesia Other Findings   Reproductive/Obstetrics negative OB ROS                           Anesthesia Physical Anesthesia Plan  ASA: II  Anesthesia Plan: General   Post-op Pain Management:    Induction: Intravenous  Airway Management Planned: LMA  Additional Equipment:   Intra-op Plan:   Post-operative Plan:   Informed Consent: I have reviewed the patients History and Physical, chart, labs and discussed the procedure including the risks, benefits and alternatives for the proposed anesthesia with the patient or authorized representative who has indicated his/her understanding and acceptance.     Plan Discussed with: CRNA, Anesthesiologist and Surgeon  Anesthesia Plan Comments:         Anesthesia Quick Evaluation

## 2013-10-05 NOTE — Progress Notes (Signed)
Patient ID: Caroline Harper, female   DOB: 1940/07/14, 73 y.o.   MRN: [id number] Op note:  Pre and post op dx: PMB   OP: D&C Hysteroscopy and removal of polyp  Op : Audrielle Vankuren MAC anesthesia EBL30 cc's

## 2013-10-05 NOTE — Anesthesia Postprocedure Evaluation (Signed)
  Anesthesia Post-op Note  Anesthesia Post Note  Patient: Caroline Harper  Procedure(s) Performed: Procedure(s) (LRB): DILATATION AND CURETTAGE /HYSTEROSCOPY (N/A) POLYPECTOMY (N/A)  Anesthesia type: General  Patient location: PACU  Post pain: Pain level controlled  Post assessment: Post-op Vital signs reviewed  Last Vitals:  Filed Vitals:   10/05/13 0915  BP: 128/62  Pulse: 82  Temp: 36.6 C  Resp: 16    Post vital signs: Reviewed  Level of consciousness: sedated  Complications: No apparent anesthesia complications

## 2013-10-06 ENCOUNTER — Encounter (HOSPITAL_COMMUNITY): Payer: Self-pay | Admitting: Obstetrics and Gynecology

## 2013-12-23 ENCOUNTER — Other Ambulatory Visit: Payer: Self-pay

## 2013-12-23 DIAGNOSIS — Z1231 Encounter for screening mammogram for malignant neoplasm of breast: Secondary | ICD-10-CM

## 2014-02-18 ENCOUNTER — Ambulatory Visit
Admission: RE | Admit: 2014-02-18 | Discharge: 2014-02-18 | Disposition: A | Discharge status: Home / Self Care | Payer: Medicare PPO | Source: Ambulatory Visit

## 2014-02-18 DIAGNOSIS — Z1231 Encounter for screening mammogram for malignant neoplasm of breast: Secondary | ICD-10-CM

## 2014-10-20 DIAGNOSIS — S60861A Insect bite (nonvenomous) of right wrist, initial encounter: Secondary | ICD-10-CM | POA: Diagnosis not present

## 2014-10-20 DIAGNOSIS — T63441A Toxic effect of venom of bees, accidental (unintentional), initial encounter: Secondary | ICD-10-CM | POA: Diagnosis not present

## 2014-11-08 DIAGNOSIS — M810 Age-related osteoporosis without current pathological fracture: Secondary | ICD-10-CM | POA: Diagnosis not present

## 2014-12-01 DIAGNOSIS — M810 Age-related osteoporosis without current pathological fracture: Secondary | ICD-10-CM | POA: Diagnosis not present

## 2015-01-20 ENCOUNTER — Other Ambulatory Visit: Payer: Self-pay

## 2015-01-20 DIAGNOSIS — Z1231 Encounter for screening mammogram for malignant neoplasm of breast: Secondary | ICD-10-CM

## 2015-02-23 ENCOUNTER — Ambulatory Visit
Admission: RE | Admit: 2015-02-23 | Discharge: 2015-02-23 | Disposition: A | Discharge status: Home / Self Care | Payer: Medicare Other | Source: Ambulatory Visit

## 2015-02-23 DIAGNOSIS — Z1231 Encounter for screening mammogram for malignant neoplasm of breast: Secondary | ICD-10-CM

## 2016-01-18 ENCOUNTER — Other Ambulatory Visit: Payer: Self-pay | Admitting: Geriatric Medicine

## 2016-01-18 DIAGNOSIS — Z1231 Encounter for screening mammogram for malignant neoplasm of breast: Secondary | ICD-10-CM

## 2016-03-01 ENCOUNTER — Ambulatory Visit
Admission: RE | Admit: 2016-03-01 | Discharge: 2016-03-01 | Disposition: A | Discharge status: Home / Self Care | Payer: Medicare Other | Source: Ambulatory Visit | Attending: Geriatric Medicine | Admitting: Geriatric Medicine

## 2016-03-01 DIAGNOSIS — Z1231 Encounter for screening mammogram for malignant neoplasm of breast: Secondary | ICD-10-CM

## 2016-06-11 ENCOUNTER — Ambulatory Visit (INDEPENDENT_AMBULATORY_CARE_PROVIDER_SITE_OTHER): Payer: Medicare Other

## 2016-06-11 ENCOUNTER — Encounter (INDEPENDENT_AMBULATORY_CARE_PROVIDER_SITE_OTHER): Payer: Self-pay | Admitting: Orthopaedic Surgery

## 2016-06-11 ENCOUNTER — Encounter (INDEPENDENT_AMBULATORY_CARE_PROVIDER_SITE_OTHER): Payer: Self-pay

## 2016-06-11 ENCOUNTER — Ambulatory Visit (INDEPENDENT_AMBULATORY_CARE_PROVIDER_SITE_OTHER): Payer: Medicare Other | Admitting: Orthopaedic Surgery

## 2016-06-11 VITALS — BP 155/87 | HR 95 | Resp 14 | Ht 66.0 in | Wt 130.0 lb

## 2016-06-11 DIAGNOSIS — M25571 Pain in right ankle and joints of right foot: Secondary | ICD-10-CM

## 2016-06-11 MED ORDER — LIDOCAINE HCL 2 % IJ SOLN
1.0000 mL | INTRAMUSCULAR | Status: AC | PRN
  Administered 2016-06-11: 1 mL

## 2016-06-11 MED ORDER — METHYLPREDNISOLONE ACETATE 40 MG/ML IJ SUSP
40.0000 mg | INTRAMUSCULAR | Status: AC | PRN
  Administered 2016-06-11: 40 mg via INTRA_ARTICULAR

## 2016-06-11 NOTE — Progress Notes (Signed)
Office Visit Note   Patient: Caroline Harper           Date of Birth: 03-23-1940           MRN: [id number] Visit Date: 06/11/2016              Requested by: Lajean Manes, MD [address] [place], [place] [address] PCP: Mathews Argyle, MD   Assessment & Plan: Visit Diagnoses:  1. Pain in right ankle and joints of right foot   Suspect the problem is soft tissue in nature with local tenderness along the anterolateral aspect of the ankle without history of injury or trauma  Plan: Cortisone injection in the area of greatest tenderness along the anterolateral aspect of the ankle. Monitor her response and if no improvement consider an MRI scan  Follow-Up Instructions: Return in about 1 month (around 07/11/2016) for if no improvement.   Orders:  Orders Placed This Encounter  Procedures  . XR Ankle Complete Right   No orders of the defined types were placed in this encounter.     Procedures: Medium Joint Inj Date/Time: 06/11/2016 9:36 AM Performed by: Garald Balding Authorized by: Garald Balding   Consent Given by:  Parent Indications:  Pain Location:  Ankle Site:  R ankle Needle Size:  27 G Approach:  Anterolateral Ultrasound Guided: No   Fluoroscopic Guidance: No   Medications:  1 mL lidocaine 2 %; 40 mg methylPREDNISolone acetate 40 MG/ML Aspiration Attempted: No       Clinical Data: No additional findings.   Subjective: Chief Complaint  Patient presents with  . Right Ankle - Pain    Caroline Harper is a 76 y o that presents with chronic Right ankle pain. She relates it has become much worse the last 5 weeks. She wears an ankle support with a lift in her shoe.   . Chest - Rib Injury    Pt cracked 2 ribs in January 2017.  Caroline Harper noted insidious onset of right ankle pain approximately 3 weeks ago. She's been doing a lot of walking but no specific injury or trauma. She's been wearing an ankle support that seems to have made a difference  but is obviously concerned that there may be a more significant problem. She's not noted any skin changes swelling or ecchymosis.  HPI  Review of Systems   Objective: Vital Signs: BP (!) 155/87   Pulse 95   Resp 14   Ht 5\' 6"  (1.676 m)   Wt 130 lb (59 kg)   BMI 20.98 kg/m   Physical Exam  Ortho Exam right ankle without tenderness along the peroneal or posterior tib tendons. She had decreased eversion of her right ankle compared to the left but without any pain. No ecchymosis. I could palpate the peroneal tendons. The only area of tenderness was somewhat mild and located along the anterior lateral ankle joint. No masses. Neurovascular exam intact. Significant pronation which is unchanged from prior exams. Large asymptomatic bunion crossing over the second toe. No midfoot pain. No crepitation with ankle motion. No subtalar  pain . Specialty Comments:  No specialty comments available.  Imaging: Xr Ankle Complete Right  Result Date: 06/11/2016 Films of the right ankle were obtained in 3 projections. There appears to be bony demineralization diffusely. Arch appears to be intact. No significant midfoot degenerative changes. No evidence of a fracture. No plantar spurring. Prominent posterior angle of the os calcis without ectopic calcification. Ankle mortise was  intact and symmetrical throughout. No evidence of a fracture. Minimal calcification at the very tip of the medial and lateral malleolus that appears old    PMFS History: Patient Active Problem List   Diagnosis Date Noted  . Osteoporosis, unspecified 06/05/2012  . Breast cancer ([place]) 05/30/2011   Past Medical History:  Diagnosis Date  . Cancer [facility]) 04-28-13   '04-left Breast-lumpectomy,radiation ,oral med.-Dr. Jana Hakim follows.  Marland Kitchen History of kidney stones    x1   . Osteoporosis, unspecified 06/05/2012    No family history on file.  Past Surgical History:  Procedure Laterality Date  . BACK SURGERY     Complete spinal  fusion(hx. scoliosis)  . BREAST SURGERY Left    '04-lumpectomy.  Lorin Mercy  04-28-13   3'08- laparoscopic -  . COLONOSCOPY     past hx, x1 - no significant findings  . COLONOSCOPY WITH PROPOFOL N/A 05/12/2013   Procedure: COLONOSCOPY WITH PROPOFOL;  Surgeon: Garlan Fair, MD;  Location: [facility];  Service: Endoscopy;  Laterality: N/A;  . HYSTEROSCOPY W/D&C N/A 10/05/2013   Procedure: DILATATION AND CURETTAGE /HYSTEROSCOPY;  Surgeon: Melina Schools, MD;  Location: [facility];  Service: Gynecology;  Laterality: N/A;  . POLYPECTOMY N/A 10/05/2013   Procedure: POLYPECTOMY;  Surgeon: Melina Schools, MD;  Location: [facility];  Service: Gynecology;  Laterality: N/A;  . SPINAL FUSION     as teenager  . TUBAL LIGATION     Social History   Occupational History  . Not on file.   Social History Main Topics  . Smoking status: Never Smoker  . Smokeless tobacco: Never Used  . Alcohol use 0.6 oz/week    1 Glasses of wine per week     Comment: wine daily at dinner  . Drug use: No  . Sexual activity: Not on file     Garald Balding, MD   Note - This record has been created using [employer].  Chart creation errors have been sought, but may not always  have been located. Such creation errors do not reflect on  the standard of medical care.

## 2016-06-28 ENCOUNTER — Telehealth (INDEPENDENT_AMBULATORY_CARE_PROVIDER_SITE_OTHER): Payer: Self-pay | Admitting: Orthopaedic Surgery

## 2016-06-28 NOTE — Telephone Encounter (Signed)
Patient calling from her summer home to inform doctor she is still experiencing pain with her rt foot, and would like to proceed with an MRI. Patient will be in town May 30,31 and June 1. Would like to get it scheduled then if possible. Please call to advise patient.

## 2016-06-28 NOTE — Telephone Encounter (Signed)
Please advise 

## 2016-06-29 ENCOUNTER — Other Ambulatory Visit (INDEPENDENT_AMBULATORY_CARE_PROVIDER_SITE_OTHER): Payer: Self-pay

## 2016-06-29 DIAGNOSIS — M79671 Pain in right foot: Secondary | ICD-10-CM

## 2016-06-29 NOTE — Telephone Encounter (Signed)
Ok to schedule.

## 2016-06-29 NOTE — Telephone Encounter (Signed)
I will put in the MRI order. Please see if the dates she provided will work.

## 2016-07-03 ENCOUNTER — Telehealth (INDEPENDENT_AMBULATORY_CARE_PROVIDER_SITE_OTHER): Payer: Self-pay | Admitting: Orthopaedic Surgery

## 2016-07-03 NOTE — Telephone Encounter (Signed)
Patient left message on voice mail requesting a call to let her know status of MRI scheduling. Patient called last week to ask to be scheduled, and has not heard anything back. Please advise.

## 2016-07-03 NOTE — Telephone Encounter (Signed)
[facility] has tried calling pt numerous times and left vm on cell to return call to schedule appt.

## 2016-07-13 ENCOUNTER — Ambulatory Visit
Admission: RE | Admit: 2016-07-13 | Discharge: 2016-07-13 | Disposition: A | Discharge status: Home / Self Care | Payer: Medicare Other | Source: Ambulatory Visit | Attending: Orthopaedic Surgery | Admitting: Orthopaedic Surgery

## 2016-07-13 DIAGNOSIS — M79671 Pain in right foot: Secondary | ICD-10-CM

## 2016-07-17 ENCOUNTER — Encounter (INDEPENDENT_AMBULATORY_CARE_PROVIDER_SITE_OTHER): Payer: Self-pay

## 2016-07-17 ENCOUNTER — Encounter (INDEPENDENT_AMBULATORY_CARE_PROVIDER_SITE_OTHER): Payer: Self-pay | Admitting: Orthopaedic Surgery

## 2016-07-17 ENCOUNTER — Ambulatory Visit (INDEPENDENT_AMBULATORY_CARE_PROVIDER_SITE_OTHER): Payer: Medicare Other | Admitting: Orthopaedic Surgery

## 2016-07-17 VITALS — BP 131/69 | HR 76 | Resp 14 | Ht 65.5 in | Wt 125.0 lb

## 2016-07-17 DIAGNOSIS — M79671 Pain in right foot: Secondary | ICD-10-CM

## 2016-07-17 MED ORDER — METHYLPREDNISOLONE ACETATE 40 MG/ML IJ SUSP
40.0000 mg | INTRAMUSCULAR | Status: AC | PRN
  Administered 2016-07-17: 40 mg via INTRA_ARTICULAR

## 2016-07-17 MED ORDER — LIDOCAINE HCL (PF) 1 % IJ SOLN
3.0000 mL | INTRAMUSCULAR | Status: AC | PRN
  Administered 2016-07-17: 3 mL

## 2016-07-17 NOTE — Progress Notes (Signed)
Office Visit Note   Patient: Caroline Harper           Date of Birth: 24-Oct-1940           MRN: [id number] Visit Date: 07/17/2016              Requested by: Lajean Manes, MD [address] [place], [place] [address] PCP: Lajean Manes, MD   Assessment & Plan: Visit Diagnoses:  1. 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 Bellomo. I think it's worth trying a local cortisone injection in the subacromial joint. I also think it's worth immobilizing that area with with a boot or an ankle support. She has both at home. We'll see her back as needed. Also needs to be sure she has the arch support in her hiking shoes. Also discussed appropriate dosages of anti-inflammatory medicine. Copy of MRI scan given to Mrs. Brockel  Follow-Up Instructions: Return if symptoms worsen or fail to improve.   Orders:  No orders of the defined types were placed in this encounter.  No orders of the defined types were placed in this encounter.     Procedures: Medium Joint Inj Date/Time: 07/17/2016 12:19 PM Performed by: Garald Balding Authorized by: Garald Balding   Indications:  Pain Location:  Ankle (foot) Site:  R ankle Prep: patient was prepped and draped in usual sterile fashion   Ultrasound Guided: No   Fluoroscopic Guidance: No   Medications:  3 mL lidocaine (PF) 1 %; 40 mg methylPREDNISolone acetate 40 MG/ML Aspiration Attempted: No       Clinical Data: No additional findings.   Subjective: Chief Complaint  Patient presents with  . Right Foot - Results    Ms. Bondy is here today for MRI results of Right foot.  Mrs. Sainato is had some trouble with her right foot for over 3 months. She loves to hike and walk with her husband and is had limitation of those activities as a result of her pain. X-rays of her foot were essentially negative.  She was having some pain in the dorsum of her foot and this was area was injected. Now she is having pain in the lateral subtalar region. MRI scan demonstrates some bony edema in the subtalar joint laterally along the middle facet without other problems.  HPI  Review of Systems   Objective: Vital Signs: BP 131/69   Pulse 76   Resp 14   Ht 5' 5.5" (1.664 m)   Wt 125 lb (56.7 kg)   BMI 20.48 kg/m   Physical Exam  Ortho Exam right foot without obvious pronation. There is a large bunion with hallux valgus. This is asymptomatic. Pain medially along the medial malleolus or behind the malleolus. Posterior tibial tendon appears to be intact. No pain with ankle range of motion. Foot was not hot red or swollen. Good pulses and normal sensibility. This pain in the subtalar joint laterally but without any skin change. No crepitation. Some decreased subtalar motion compared to the left side. Achilles tendon is intact without pain.  Specialty Comments:  No specialty comments available.  Imaging: No results found.   PMFS History: Patient Active Problem List   Diagnosis Date Noted  . Osteoporosis, unspecified 06/05/2012  . Breast cancer ([place]) 05/30/2011   Past Medical History:  Diagnosis Date  .  Cancer [facility]) 04-28-13   '04-left Breast-lumpectomy,radiation ,oral med.-Dr. Jana Hakim follows.  Marland Kitchen History of kidney stones    x1   . Osteoporosis, unspecified 06/05/2012    History reviewed. No pertinent family history.  Past Surgical History:  Procedure Laterality Date  . BACK SURGERY     Complete spinal fusion(hx. scoliosis)  . BREAST SURGERY Left    '04-lumpectomy.  Lorin Mercy  04-28-13   3'08- laparoscopic -  . COLONOSCOPY     past hx, x1 - no significant findings  . COLONOSCOPY WITH PROPOFOL N/A 05/12/2013   Procedure: COLONOSCOPY WITH PROPOFOL;  Surgeon: Garlan Fair, MD;  Location: [facility];  Service: Endoscopy;  Laterality: N/A;  . HYSTEROSCOPY W/D&C N/A 10/05/2013    Procedure: DILATATION AND CURETTAGE /HYSTEROSCOPY;  Surgeon: Melina Schools, MD;  Location: [facility];  Service: Gynecology;  Laterality: N/A;  . POLYPECTOMY N/A 10/05/2013   Procedure: POLYPECTOMY;  Surgeon: Melina Schools, MD;  Location: [facility];  Service: Gynecology;  Laterality: N/A;  . SPINAL FUSION     as teenager  . TUBAL LIGATION     Social History   Occupational History  . Not on file.   Social History Main Topics  . Smoking status: Never Smoker  . Smokeless tobacco: Never Used  . Alcohol use 0.6 oz/week    1 Glasses of wine per week     Comment: wine daily at dinner  . Drug use: No  . Sexual activity: Not on file     Garald Balding, MD   Note - This record has been created using [employer].  Chart creation errors have been sought, but may not always  have been located. Such creation errors do not reflect on  the standard of medical care.

## 2016-11-12 ENCOUNTER — Other Ambulatory Visit: Payer: Self-pay | Admitting: Nurse Practitioner

## 2016-11-12 ENCOUNTER — Ambulatory Visit
Admission: RE | Admit: 2016-11-12 | Discharge: 2016-11-12 | Disposition: A | Discharge status: Home / Self Care | Payer: Medicare Other | Source: Ambulatory Visit | Attending: Nurse Practitioner | Admitting: Nurse Practitioner

## 2016-11-12 DIAGNOSIS — R1031 Right lower quadrant pain: Secondary | ICD-10-CM

## 2016-11-13 ENCOUNTER — Emergency Department (HOSPITAL_COMMUNITY)
Admission: EM | Admit: 2016-11-13 | Discharge: 2016-11-13 | Disposition: A | Discharge status: Home / Self Care | Payer: Medicare Other | Attending: Emergency Medicine | Admitting: Emergency Medicine

## 2016-11-13 ENCOUNTER — Emergency Department (HOSPITAL_COMMUNITY): Payer: Medicare Other

## 2016-11-13 ENCOUNTER — Encounter (HOSPITAL_COMMUNITY): Payer: Self-pay | Admitting: Emergency Medicine

## 2016-11-13 DIAGNOSIS — R1031 Right lower quadrant pain: Secondary | ICD-10-CM | POA: Diagnosis present

## 2016-11-13 DIAGNOSIS — Z853 Personal history of malignant neoplasm of breast: Secondary | ICD-10-CM | POA: Insufficient documentation

## 2016-11-13 DIAGNOSIS — R350 Frequency of micturition: Secondary | ICD-10-CM | POA: Insufficient documentation

## 2016-11-13 DIAGNOSIS — R3915 Urgency of urination: Secondary | ICD-10-CM | POA: Insufficient documentation

## 2016-11-13 DIAGNOSIS — N200 Calculus of kidney: Secondary | ICD-10-CM | POA: Diagnosis not present

## 2016-11-13 DIAGNOSIS — Z79899 Other long term (current) drug therapy: Secondary | ICD-10-CM | POA: Diagnosis not present

## 2016-11-13 LAB — URINALYSIS, ROUTINE W REFLEX MICROSCOPIC
BILIRUBIN URINE: NEGATIVE
Bacteria, UA: NONE SEEN
GLUCOSE, UA: NEGATIVE mg/dL
KETONES UR: NEGATIVE mg/dL
NITRITE: NEGATIVE
PH: 6 (ref 5.0–8.0)
Protein, ur: NEGATIVE mg/dL
Specific Gravity, Urine: 1.014 (ref 1.005–1.030)
Squamous Epithelial / LPF: NONE SEEN

## 2016-11-13 LAB — BASIC METABOLIC PANEL
Anion gap: 9 (ref 5–15)
BUN: 15 mg/dL (ref 6–20)
CHLORIDE: 103 mmol/L (ref 101–111)
CO2: 26 mmol/L (ref 22–32)
Calcium: 9.1 mg/dL (ref 8.9–10.3)
Creatinine, Ser: 0.66 mg/dL (ref 0.44–1.00)
GFR calc non Af Amer: >60 mL/min (ref >60)
Glucose, Bld: 108 mg/dL — ABNORMAL HIGH (ref 65–99)
POTASSIUM: 3.8 mmol/L (ref 3.5–5.1)
SODIUM: 138 mmol/L (ref 135–145)

## 2016-11-13 LAB — CBC WITH DIFFERENTIAL/PLATELET
BASOS PCT: 0 %
Basophils Absolute: 0 10*3/uL (ref 0.0–0.1)
EOS ABS: 0 10*3/uL (ref 0.0–0.7)
Eosinophils Relative: 0 %
HCT: 36.6 % (ref 36.0–46.0)
HEMOGLOBIN: 12.7 g/dL (ref 12.0–15.0)
LYMPHS ABS: 1.6 10*3/uL (ref 0.7–4.0)
Lymphocytes Relative: 15 %
MCH: 35.5 pg — AB (ref 26.0–34.0)
MCHC: 34.7 g/dL (ref 30.0–36.0)
MCV: 102.2 fL — ABNORMAL HIGH (ref 78.0–100.0)
Monocytes Absolute: 0.9 10*3/uL (ref 0.1–1.0)
Monocytes Relative: 9 %
NEUTROS ABS: 7.9 10*3/uL — AB (ref 1.7–7.7)
Neutrophils Relative %: 76 %
Platelets: 223 10*3/uL (ref 150–400)
RBC: 3.58 MIL/uL — AB (ref 3.87–5.11)
RDW: 12.1 % (ref 11.5–15.5)
WBC: 10.4 10*3/uL (ref 4.0–10.5)

## 2016-11-13 MED ORDER — TAMSULOSIN HCL 0.4 MG PO CAPS
0.4000 mg | ORAL_CAPSULE | Freq: Once | ORAL | Status: AC
  Administered 2016-11-13: 0.4 mg via ORAL
  Filled 2016-11-13: qty 1

## 2016-11-13 MED ORDER — TAMSULOSIN HCL 0.4 MG PO CAPS: 0.4000 mg | ORAL_CAPSULE | Freq: Every day | ORAL | 0 refills | Status: DC

## 2016-11-13 MED ORDER — TRAMADOL HCL 50 MG PO TABS: 50.0000 mg | ORAL_TABLET | Freq: Four times a day (QID) | ORAL | 0 refills | Status: DC | PRN

## 2016-11-13 NOTE — ED Provider Notes (Signed)
[facility] DEPT Provider Note   CSN: [id number] Arrival date & time: 11/13/16  1532     History   Chief Complaint Chief Complaint  Patient presents with  . Nephrolithiasis    HPI Caroline Harper is a 76 y.o. female.  Pt is a 76 y/o female with pmh of breast CA, kidney stones and osteoporosis presenting with intermittent, worsening right flank and RLQ pain.  Pt states about 8 days ago while in [place] she developed bloody urine right before getting on a plane and pain in her right back with some bloating, urinary urgency and frequency.  She saw her PCP the next day and there was concern for possible UTI and she was placed on cipro.  Pt took 4 days of cipro however she continued to get intermittent episodes of pain in the right flank and RLQ.  Pt when it was there was severe 10/10 and would sometimes cause vomiting.  She saw her PCP yesterday due to worsening pain and received a CT which showed a 5x40mm stone with right hydronephrosis.  Pt was fine all night and then today this afternoon her pain returned.  She has had severe pain over the last 3 hours but in the last 58min it has subsided.  No fever, diarrhea and she has not taken anything for pain.   The history is provided by the patient.    Past Medical History:  Diagnosis Date  . Cancer [facility]) 04-28-13   '04-left Breast-lumpectomy,radiation ,oral med.-Dr. Jana Hakim follows.  Marland Kitchen History of kidney stones    x1   . Osteoporosis, unspecified 06/05/2012    Patient Active Problem List   Diagnosis Date Noted  . Osteoporosis, unspecified 06/05/2012  . Breast cancer ([place]) 05/30/2011    Past Surgical History:  Procedure Laterality Date  . BACK SURGERY     Complete spinal fusion(hx. scoliosis)  . BREAST SURGERY Left    '04-lumpectomy.  Lorin Mercy  04-28-13   3'08- laparoscopic -  . COLONOSCOPY     past hx, x1 - no significant findings  . COLONOSCOPY WITH PROPOFOL N/A 05/12/2013   Procedure: COLONOSCOPY WITH PROPOFOL;  Surgeon:  Garlan Fair, MD;  Location: [facility];  Service: Endoscopy;  Laterality: N/A;  . HYSTEROSCOPY W/D&C N/A 10/05/2013   Procedure: DILATATION AND CURETTAGE /HYSTEROSCOPY;  Surgeon: Melina Schools, MD;  Location: [facility];  Service: Gynecology;  Laterality: N/A;  . POLYPECTOMY N/A 10/05/2013   Procedure: POLYPECTOMY;  Surgeon: Melina Schools, MD;  Location: [facility];  Service: Gynecology;  Laterality: N/A;  . SPINAL FUSION     as teenager  . TUBAL LIGATION      OB History    No data available       Home Medications    Prior to Admission medications   Medication Sig Start Date End Date Taking? Authorizing Provider  BIOTIN PO Take 10,000 mcg by mouth daily.     [provider]  calcium-vitamin D (OSCAL WITH D) 500-200 MG-UNIT per tablet Take 1 tablet by mouth 2 (two) times daily.    [provider]  denosumab (PROLIA) 60 MG/ML SOLN injection Inject 60 mg into the skin every 6 (six) months. Administer in upper arm, thigh, or abdomen    [provider]  fish oil-omega-3 fatty acids 1000 MG capsule Take 1 g by mouth daily.    [provider]  ibuprofen (ADVIL,MOTRIN) 600 MG tablet Take 1 tablet (600 mg total) by mouth every 6 (six) hours as needed.  10/05/13   Newton Pigg, MD  Multiple Vitamin (MULTIVITAMIN) tablet Take 1 tablet by mouth daily.    [provider]  vitamin C (ASCORBIC ACID) 500 MG tablet Take 500 mg by mouth daily.    [provider]    Family History No family history on file.  Social History Social History  Substance Use Topics  . Smoking status: Never Smoker  . Smokeless tobacco: Never Used  . Alcohol use 0.6 oz/week    1 Glasses of wine per week     Comment: wine daily at dinner     Allergies   Patient has no known allergies.   Review of Systems Review of Systems  All other systems reviewed and are negative.    Physical Exam Updated Vital Signs BP (!) 177/82 (BP Location: Right Arm)   Pulse  80   Temp 98.2 F (36.8 C) (Oral)   Resp 19   Ht 5' 5.5" (1.664 m)   Wt 55.8 kg (123 lb)   SpO2 100%   BMI 20.16 kg/m   Physical Exam  Constitutional: She is oriented to person, place, and time. She appears well-developed and well-nourished. No distress.  HENT:  Head: Normocephalic and atraumatic.  Mouth/Throat: Oropharynx is clear and moist.  Eyes: Pupils are equal, round, and reactive to light. Conjunctivae and EOM are normal.  Neck: Normal range of motion. Neck supple.  Cardiovascular: Normal rate, regular rhythm and intact distal pulses.   No murmur heard. Pulmonary/Chest: Effort normal and breath sounds normal. No respiratory distress. She has no wheezes. She has no rales.  Abdominal: Soft. She exhibits no distension. There is tenderness in the right lower quadrant. There is CVA tenderness. There is no rebound and no guarding.  Right flank pain  Musculoskeletal: Normal range of motion. She exhibits no edema or tenderness.  Neurological: She is alert and oriented to person, place, and time.  Skin: Skin is warm and dry. No rash noted. No erythema.  Psychiatric: She has a normal mood and affect. Her behavior is normal.  Nursing note and vitals reviewed.    ED Treatments / Results  Labs (all labs ordered are listed, but only abnormal results are displayed) Labs Reviewed  CBC WITH DIFFERENTIAL/PLATELET - Abnormal; Notable for the following:       Result Value   RBC 3.58 (*)    MCV 102.2 (*)    MCH 35.5 (*)    Neutro Abs 7.9 (*)    All other components within normal limits  BASIC METABOLIC PANEL - Abnormal; Notable for the following:    Glucose, Bld 108 (*)    All other components within normal limits  URINALYSIS, ROUTINE W REFLEX MICROSCOPIC - Abnormal; Notable for the following:    APPearance HAZY (*)    Hgb urine dipstick SMALL (*)    Leukocytes, UA TRACE (*)    All other components within normal limits  URINE CULTURE    EKG  EKG Interpretation None        Radiology Ct Abdomen Pelvis Wo Contrast  Result Date: 11/12/2016 CLINICAL DATA:  Right lower quadrant abdominal pain EXAM: CT ABDOMEN AND PELVIS WITHOUT CONTRAST TECHNIQUE: Multidetector CT imaging of the abdomen and pelvis was performed following the standard protocol without IV contrast. COMPARISON:  CT abdomen pelvis 09/03/2013 FINDINGS: Lower chest: No pulmonary nodules or pleural effusion. No visible pericardial effusion. Hepatobiliary: Normal hepatic contours and density. No visible biliary dilatation. Status post cholecystectomy. Pancreas: Normal contours without ductal dilatation. No peripancreatic fluid  collection. Spleen: Unchanged splenic calcifications. Adrenals/Urinary Tract: --Adrenal glands: Normal. --Right kidney/ureter: Severe right hydroureteronephrosis with obstructing stone at the right ureterovesical junction measuring 5 x 4 mm. Mild perinephric stranding. No other right-sided calculi are visualized. --Left kidney/ureter: No hydronephrosis or perinephric stranding. No nephrolithiasis. No obstructing ureteral stones. --Urinary bladder: Unremarkable. Stomach/Bowel: --Stomach/Duodenum: No hiatal hernia or other gastric abnormality. Normal duodenal course and caliber. --Small bowel: No dilatation or inflammation. --Colon: Rectosigmoid diverticulosis without acute inflammation. --Appendix: Not visualized. No right lower quadrant inflammation or free fluid. Vascular/Lymphatic: Atherosclerotic calcification is present within the non-aneurysmal abdominal aorta, without hemodynamically significant stenosis. No abdominal or pelvic lymphadenopathy. Reproductive: Left adnexal cyst measures 3.8 cm, unchanged. Musculoskeletal. Multilevel degenerative disc disease and facet arthrosis. No bony spinal canal stenosis. Left convex scoliosis with apex at L3. The visible thoracolumbar spine is fused posteriorly. Other: None. IMPRESSION: 1. Right-sided obstructive uropathy with 5 x 4 mm stone at the  ureterovesical junction and severe hydroureteronephrosis. 2.  Aortic Atherosclerosis (ICD10-I70.0). 3. Thoracolumbar levoscoliosis and fusion. Electronically Signed   By: Ulyses Jarred M.D.   On: 11/12/2016 16:21   Dg Abdomen 1 View  Result Date: 11/13/2016 CLINICAL DATA:  Right flank pain.  Known kidney stones. EXAM: ABDOMEN - 1 VIEW COMPARISON:  None. FINDINGS: The bowel gas pattern is normal. The 5 mm calculus at the right ureterovesical junction is unchanged in position, allowing for differences in technique. Prior cholecystectomy. Levoscoliosis of the lumbar spine. Diffuse osteopenia. IMPRESSION: No interval change in position of the 5 mm right UVJ calculus. Electronically Signed   By: Titus Dubin M.D.   On: 11/13/2016 17:20    Procedures Procedures (including critical care time)  Medications Ordered in ED Medications - No data to display   Initial Impression / Assessment and Plan / ED Course  I have reviewed the triage vital signs and the nursing notes.  Pertinent labs & imaging results that were available during my care of the patient were reviewed by me and considered in my medical decision making (see chart for details).     Pt with symptoms consistent with kidney stone.  Denies infectious sx, or GI symptoms.  Low concern for diverticulitis and no risk factors or history suggestive of AAA. Currently pt's pain has subsided.  CT yesterday showed stone and UVJ.  Possible with this last bout of pain it has passed.  Will get plain film to see if passed.  CBC, BMP and UA pending.  6:51 PM Labs show a normal creatinine, normal white blood cell count and urine without significant findings concerning for infection. Urine culture was sent. X-ray shows stone is in the same position. Patient still is not reporting any pain at this time. She has not taken any anti-inflammatories throughout this process. Discussed with Dr. Louis Meckel with urology who recommended starting Flomax, avoiding NSAIDs  and following up in the office tomorrow. Patient and her husband are happy with this plan.  Final Clinical Impressions(s) / ED Diagnoses   Final diagnoses:  Kidney stone    New Prescriptions New Prescriptions   TAMSULOSIN (FLOMAX) 0.4 MG CAPS CAPSULE    Take 1 capsule (0.4 mg total) by mouth daily after supper.   TRAMADOL (ULTRAM) 50 MG TABLET    Take 1 tablet (50 mg total) by mouth every 6 (six) hours as needed.     Blanchie Dessert, MD 11/13/16 [id number]

## 2016-11-13 NOTE — ED Triage Notes (Signed)
Patient c/o right flank pain for several days. Had CT scan done yesterday and was told to come to ED due to obstructing right kidney stone.  Patient having urinary urgency but has retention with little coming out.

## 2016-11-15 LAB — URINE CULTURE

## 2016-12-05 ENCOUNTER — Ambulatory Visit
Admission: RE | Admit: 2016-12-05 | Discharge: 2016-12-05 | Disposition: A | Discharge status: Home / Self Care | Payer: Medicare Other | Source: Ambulatory Visit | Attending: Sports Medicine | Admitting: Sports Medicine

## 2016-12-05 ENCOUNTER — Ambulatory Visit (INDEPENDENT_AMBULATORY_CARE_PROVIDER_SITE_OTHER): Payer: Medicare Other | Admitting: Family Medicine

## 2016-12-05 ENCOUNTER — Telehealth: Payer: Self-pay | Admitting: Family Medicine

## 2016-12-05 VITALS — BP 108/78 | Ht 65.5 in | Wt 124.0 lb

## 2016-12-05 DIAGNOSIS — M25571 Pain in right ankle and joints of right foot: Secondary | ICD-10-CM | POA: Diagnosis not present

## 2016-12-05 DIAGNOSIS — G8929 Other chronic pain: Secondary | ICD-10-CM

## 2016-12-05 NOTE — Progress Notes (Signed)
Chief complaint: Right ankle pain7 months  History of present illness: Caroline Harper is a 76 year old female who presents to the sports medicine office today with chief complaint of right ankle pain. She reports having right anterolateral ankle pain for the last 7 months. She does not report of any inciting factor, trauma, or injury to explain the pain. She describes a gradual onset of pain, does not report of any swelling, warmth, ecchymosis, erythema at any point in time. She reports that she does walk on a daily basis with her husband. She initially was fitted with ankle brace, did not report of any improvement in her symptoms. She then saw Dr. Durward Fortes with [facility] orthopedics for specialty evaluation of this. X-rays were done, which did show diffuse bony demineralization and midfoot osteoarthritic changes, but no evidence of any acute osseous deformity. There was a prominent posterior angle of the os calcis. There was no evidence of degenerative or joint space narrowing on ankle mortise view. She was given a cortisone injection in the area where she had the most tenderness, which was along the anterolateral aspect of the ankle. She reports it did help for approximately 6 weeks. On follow-up in June of this year, she was reevaluated by Dr. Durward Fortes due to continued pain. MRI of the right ankle was done, showed focal area of subcortical sclerosis and edema of the lateral aspect of the posterior subtalar facet. It did not seem characteristic of any malignancy, there was question whether this was related to a previous bone infarction. She did have no ligamentous abnormality, normal peroneal tendons, normal tibialis posterior and flexor tendons, normal tibialis anterior and extensor tendons, normal Achilles and plantar fascia. At that time, she was given another cortisone injection and she was fitted with an Banker. She was also given rigid orthotics to wear. She reports after a few weeks, she took the Cam  walker boot but continued to have pain. She did see her primary physician 2 days ago for continued pain. She was kindly referred here for further evaluation. She does report a few years ago having similar pain, she reports that when she was walking on the roads with her husband the road was uneven and had a slight downward slanting. She reports now she has been walking straight in the middle of the road. She does reports thinking 10 years ago she may have fractured one of her ankle bones, reports that [place] She does not have copies of any type of imaging or records.  Her past medical history is notable for breast cancer and osteoporosis, no previous surgery involving the right ankle or right foot, she does have diagnosis of bilateral hallux valgus, she does not report of any current tobacco use, does not report a family history of osteoarthritis or diabetes.  Review of systems:  As stated above   Physical exam: Vital signs are reviewed and are documented in the chart Gen.: Alert, oriented, appears stated age, in no apparent distress HEENT: Moist oral mucosa Respiratory: Normal respirations, able to speak in full sentences Cardiac: Regular rate, distal pulses 2+ Integumentary: No rashes on visible skin:  Neurologic: Strength 5/5, sensation 2+ in bilateral lower extremities Psych: Normal affect, mood is described as good Musculoskeletal: Inspection of her right foot and ankle reveal that she does have very obvious hallux valgus of first toe, but otherwise no acute bony deformity or muscle atrophy seen, no warmth, erythema, ecchymosis, or effusion, she is tender  to palpation along the anterolateral aspect of talus, no tenderness to palpation over medial malleolus, lateral malleolus, medial talus, calcaneus, Lisfranc, navicular, or base of fifth metatarsal, she does have full ankle range of motion without any elicitation of pain, anterior drawer and  talar tilt negative, on foot evaluation she does have bilateral calcaneal valgus, more noticeable on the left than right, about 10 on left, 5 on right, she does have slight loss of transverse arch but no evidence of any obvious pes planus, she additionally has hallux valgus of the left first toe  Assessment and plan: 1. Chronic right ankle pain, with MRI evidence of unusual area of subcortical sclerosis and edema adjacent to the lateral aspect of the posterior subtalus  Chronic right ankle pain -Reviewed MRI results with patient in room today and showed her the MRI personally -Discussed best route in management would be treating her biomechanical issues, discussed that this is not something she would need surgery for, do not feel repeat cortisone injection is warranted today -Did discuss with her that she will need repeat x-ray of her right ankle, will order for 3 views including AP, lateral, and mortise views -Discussed with her over the next 2 weeks to continue with her orthotics that were given,  she will return here in the office in 2 weeks for making of custom orthotics  She'll return in 2 weeks or sooner as needed. I will call her after x-ray results.   Mort Sawyers, M.D. [place]

## 2016-12-05 NOTE — Telephone Encounter (Signed)
[employer]. Unfortunately she did not answer and I left detailed voice message on her home phone machine discussing her XR results of her right ankle. I do not see any interval changes or worsening of any osteoarthritis in her right ankle. Still see sclerotic appearance in talus, but not as well defined as it was on MRI. Nonetheless, discussed it's not going to change management, she will return in 2 weeks for custom orthotics or sooner as needed. Discussed to have her call office with any questions or concerns.  Mort Sawyers, MD Primary Care Sports Medicine Fellow [facility]

## 2016-12-18 ENCOUNTER — Encounter: Payer: Self-pay | Admitting: Sports Medicine

## 2016-12-18 ENCOUNTER — Ambulatory Visit: Payer: Medicare Other | Admitting: Sports Medicine

## 2016-12-18 VITALS — BP 138/80 | Ht 65.0 in | Wt 124.0 lb

## 2016-12-18 DIAGNOSIS — R269 Unspecified abnormalities of gait and mobility: Secondary | ICD-10-CM

## 2016-12-18 NOTE — Progress Notes (Signed)
Chief complaint: Needing orthotics  History of present illness: Caroline Harper is a 76 year old female who presents to the sports medicine office today for orthotics.  She does not report of any acute concerns today.  I last saw her in the office about 2 weeks ago for chronic right ankle pain. On exam she was found to have bilateral hallux valgus and hallux rigidus.  She does walk about 45 minutes to an hour daily with her husband.   It was recommended by me to come here for orthotics to help with the pain that she is having.  Review of systems:  As stated above  Her past medical history, surgical history, family history, and social history obtained and unchanged. Has history of breast cancer and osteoporosis, no previous surgery involving the right ankle, no current tobacco use, no family history of osteoarthritis or diabetes  Physical exam: Vital signs are reviewed and are documented in the chart Gen.: Alert, oriented, appears stated age, in no apparent distress HEENT: Moist oral mucosa Respiratory: Normal respirations, able to speak in full sentences Cardiac: Regular rate, distal pulses 2+ Integumentary: No rashes on visible skin:  Neurologic: Strength 5/5, sensation 2+ in bilateral lower extremities Psych: Normal affect, mood is described as good Musculoskeletal: Inspection of her right foot and ankle reveal that she does have very obvious hallux valgus of first toe, but otherwise no acute bony deformity or muscle atrophy seen, no warmth, erythema, ecchymosis, or effusion, she is tender to palpation along the anterolateral aspect of talus, no tenderness to palpation over medial malleolus, lateral malleolus, medial talus, calcaneus, Lisfranc, navicular, or base of fifth metatarsal, she does have full ankle range of motion without any elicitation of pain, anterior drawer and talar tilt negative, on foot evaluation she does have bilateral calcaneal valgus, more noticeable on the left than right,  about 10 on left, 5 on right, she does have slight loss of transverse arch but no evidence of any obvious pes planus, she additionally has hallux valgus of the left first toe  Patient was fitted for a : standard, cushioned, semi-rigid orthotic. The orthotic was heated and afterward the patient stood on the orthotic blank positioned on the orthotic stand. The patient was positioned in subtalar neutral position and 10 degrees of ankle dorsiflexion in a weight bearing stance. After completion of molding, a stable base was applied to the orthotic blank. The blank was ground to a stable position for weight bearing. Size: 6 Base: Blue EVA Posting: First ray post Additional orthotic padding: Metatarsal cookie   Assessment and plan: 1. Gait abnormality, bilateral hallux valgus and bilateral hallux rigidus  Plan: Custom orthotics were created as above, was fitted to her satisfaction.   Greater than 50% of time today was spent in direct evaluation and counseling of the patient, additionally with time creating an customizing the orthotic. She will follow-up on as-needed basis.  Mort Sawyers, MD Primary Care Sports Medicine fellow [facility]

## 2017-02-01 ENCOUNTER — Encounter (HOSPITAL_BASED_OUTPATIENT_CLINIC_OR_DEPARTMENT_OTHER): Payer: Self-pay | Admitting: Emergency Medicine

## 2017-02-01 ENCOUNTER — Emergency Department (HOSPITAL_BASED_OUTPATIENT_CLINIC_OR_DEPARTMENT_OTHER)
Admission: EM | Admit: 2017-02-01 | Discharge: 2017-02-02 | Disposition: A | Discharge status: Home / Self Care | Payer: Medicare Other | Attending: Emergency Medicine | Admitting: Emergency Medicine

## 2017-02-01 ENCOUNTER — Other Ambulatory Visit: Payer: Self-pay

## 2017-02-01 ENCOUNTER — Emergency Department (HOSPITAL_BASED_OUTPATIENT_CLINIC_OR_DEPARTMENT_OTHER): Payer: Medicare Other

## 2017-02-01 DIAGNOSIS — M19042 Primary osteoarthritis, left hand: Secondary | ICD-10-CM | POA: Insufficient documentation

## 2017-02-01 DIAGNOSIS — Z79899 Other long term (current) drug therapy: Secondary | ICD-10-CM | POA: Insufficient documentation

## 2017-02-01 DIAGNOSIS — M79642 Pain in left hand: Secondary | ICD-10-CM | POA: Diagnosis present

## 2017-02-01 MED ORDER — IBUPROFEN 600 MG PO TABS: 600.0000 mg | ORAL_TABLET | Freq: Four times a day (QID) | ORAL | 0 refills | Status: DC | PRN

## 2017-02-01 NOTE — ED Provider Notes (Signed)
[facility] EMERGENCY DEPARTMENT Provider Note   CSN: [id number] Arrival date & time: 02/01/17  2142     History   Chief Complaint Chief Complaint  Patient presents with  . Hand Pain    HPI Caroline Harper is a 76 y.o. female.  HPI   76 year old female with history of osteoporosis presenting for evaluation of pain in the left hand.  Patient developed pain to her left wrist and hand that started approximately 4 hours ago after she finished with dinner.  She tries taking a warm shower and take some ibuprofen initially but the pain still persists.  Pain is described as a sharp throbbing sensation moderate in severity.  While waiting in the ER and receiving ice pack, her pain has improved.  Pain is minimal at this time.  She is right-hand dominant.  She denies any associated fever, chills, lightheadedness, dizziness, chest pain, arm pain.  She denies any recent strenuous activities or injury.  She does have history of osteoporosis.  Past Medical History:  Diagnosis Date  . Cancer [facility]) 04-28-13   '04-left Breast-lumpectomy,radiation ,oral med.-Dr. Jana Hakim follows.  Marland Kitchen History of kidney stones    x1   . Osteoporosis, unspecified 06/05/2012    Patient Active Problem List   Diagnosis Date Noted  . Osteoporosis, unspecified 06/05/2012  . Breast cancer ([place]) 05/30/2011    Past Surgical History:  Procedure Laterality Date  . BACK SURGERY     Complete spinal fusion(hx. scoliosis)  . BREAST SURGERY Left    '04-lumpectomy.  Lorin Mercy  04-28-13   3'08- laparoscopic -  . COLONOSCOPY     past hx, x1 - no significant findings  . COLONOSCOPY WITH PROPOFOL N/A 05/12/2013   Procedure: COLONOSCOPY WITH PROPOFOL;  Surgeon: Garlan Fair, MD;  Location: [facility];  Service: Endoscopy;  Laterality: N/A;  . HYSTEROSCOPY W/D&C N/A 10/05/2013   Procedure: DILATATION AND CURETTAGE /HYSTEROSCOPY;  Surgeon: Melina Schools, MD;  Location: [facility];  Service: Gynecology;   Laterality: N/A;  . POLYPECTOMY N/A 10/05/2013   Procedure: POLYPECTOMY;  Surgeon: Melina Schools, MD;  Location: [facility];  Service: Gynecology;  Laterality: N/A;  . SPINAL FUSION     as teenager  . TUBAL LIGATION      OB History    No data available       Home Medications    Prior to Admission medications   Medication Sig Start Date End Date Taking? Authorizing Provider  acetaminophen (TYLENOL) 500 MG tablet Take 500 mg by mouth every 6 (six) hours as needed for moderate pain.    [provider]  BIOTIN PO Take 10,000 mcg by mouth daily.     [provider]  calcium-vitamin D (OSCAL WITH D) 500-200 MG-UNIT per tablet Take 1 tablet by mouth 2 (two) times daily.    [provider]  denosumab (PROLIA) 60 MG/ML SOLN injection Inject 60 mg into the skin every 6 (six) months. Administer in upper arm, thigh, or abdomen    [provider]  fish oil-omega-3 fatty acids 1000 MG capsule Take 1 g by mouth daily.    [provider]  ibuprofen (ADVIL,MOTRIN) 600 MG tablet Take 1 tablet (600 mg total) by mouth every 6 (six) hours as needed. Patient not taking: Reported on 11/13/2016 10/05/13   Newton Pigg, MD  Multiple Vitamin (MULTIVITAMIN) tablet Take 1 tablet by mouth daily.    [provider]  tamsulosin (FLOMAX) 0.4 MG CAPS capsule Take 1 capsule (  0.4 mg total) by mouth daily after supper. 11/13/16   Blanchie Dessert, MD  traMADol (ULTRAM) 50 MG tablet Take 1 tablet (50 mg total) by mouth every 6 (six) hours as needed. 11/13/16   Blanchie Dessert, MD  vitamin C (ASCORBIC ACID) 500 MG tablet Take 500 mg by mouth daily.    [provider]    Family History No family history on file.  Social History Social History   Tobacco Use  . Smoking status: Never Smoker  . Smokeless tobacco: Never Used  Substance Use Topics  . Alcohol use: Yes    Alcohol/week: 0.6 oz    Types: 1 Glasses of wine per week    Comment: wine daily at  dinner  . Drug use: No     Allergies   Patient has no known allergies.   Review of Systems Review of Systems  Constitutional: Negative for fever.  Musculoskeletal: Positive for arthralgias.  Skin: Negative for rash and wound.     Physical Exam Updated Vital Signs BP (!) 150/74 (BP Location: Right Arm)   Pulse 85   Temp 98 F (36.7 C) (Oral)   Resp 18   SpO2 98%   Physical Exam  Constitutional: She appears well-developed and well-nourished. No distress.  HENT:  Head: Atraumatic.  Eyes: Conjunctivae are normal.  Neck: Neck supple.  Musculoskeletal: She exhibits tenderness (Left arm: Mild tenderness to left wrist and hand diffusely without focal tenderness and no deformity or overlying skin changes.  Brisk cap refill to all fingers, radial pulse 2+).  Neurological: She is alert.  Skin: No rash noted.  Psychiatric: She has a normal mood and affect.  Nursing note and vitals reviewed.    ED Treatments / Results  Labs (all labs ordered are listed, but only abnormal results are displayed) Labs Reviewed - No data to display  EKG  EKG Interpretation None       Radiology Dg Wrist Complete Left  Result Date: 02/01/2017 CLINICAL DATA:  Left wrist pain without known injury EXAM: LEFT WRIST - COMPLETE 3+ VIEW COMPARISON:  None. FINDINGS: The bones are diffusely osteopenic. There is mild narrowing of the radiocarpal joint space. No erosions. No fracture or dislocation. Soft tissues are unremarkable. IMPRESSION: Osteopenia without acute abnormality of the left wrist. Electronically Signed   By: Ulyses Jarred M.D.   On: 02/01/2017 22:30   Dg Hand Complete Left  Result Date: 02/01/2017 CLINICAL DATA:  Wrist and hand pain without known injury EXAM: LEFT HAND - COMPLETE 3+ VIEW COMPARISON:  None. FINDINGS: The bones are osteopenic. There is severe joint space loss with mild osteophyte formation at the proximal and distal interphalangeal joints. There is degeneration at the  first carpometacarpal joint. No acute fracture. IMPRESSION: Diffuse osteopenia and moderate osteoarthrosis of the left hand. No acute abnormality. Electronically Signed   By: Ulyses Jarred M.D.   On: 02/01/2017 22:29    Procedures Procedures (including critical care time)  Medications Ordered in ED Medications - No data to display   Initial Impression / Assessment and Plan / ED Course  I have reviewed the triage vital signs and the nursing notes.  Pertinent labs & imaging results that were available during my care of the patient were reviewed by me and considered in my medical decision making (see chart for details).     BP (!) 150/74 (BP Location: Right Arm)   Pulse 85   Temp 98 F (36.7 C) (Oral)   Resp 18   SpO2 98%  Final Clinical Impressions(s) / ED Diagnoses   Final diagnoses:  Osteoarthritis of left hand, unspecified osteoarthritis type    ED Discharge Orders        Ordered    ibuprofen (ADVIL,MOTRIN) 600 MG tablet  Every 6 hours PRN     02/01/17 2353     11:51 PM Patient presents today with atraumatic left hand and wrist pain that started earlier today.  She is right-hand dominant.  She does have some mild tenderness diffusely throughout her hand and wrist but no overlying skin changes to suggest infection.  She is neurovascularly intact.  X-ray of her wrist and hand demonstrate evidence of osteoarthritis which I suspect is the cause of her pain.  Pain has improved with ibuprofen at home and eyes.  Encourage patient to continue with her current treatment.  Follow-up with PCP as needed.  Return precautions discussed.  Care discussed with Dr. Randal Buba.   Domenic Moras, PA-C 02/01/17 Vance Gather, April, MD 02/02/17 [place]er Pickerel

## 2017-02-01 NOTE — ED Triage Notes (Signed)
Pt presents with c/o left hand pain that started a couple hours ago without injury. Pt reports she is unable to bend the hand

## 2017-02-26 ENCOUNTER — Other Ambulatory Visit: Payer: Self-pay | Admitting: Geriatric Medicine

## 2017-02-26 DIAGNOSIS — Z1231 Encounter for screening mammogram for malignant neoplasm of breast: Secondary | ICD-10-CM

## 2017-03-12 ENCOUNTER — Ambulatory Visit (INDEPENDENT_AMBULATORY_CARE_PROVIDER_SITE_OTHER): Payer: Medicare Other | Admitting: Sports Medicine

## 2017-03-12 ENCOUNTER — Encounter: Payer: Self-pay | Admitting: Sports Medicine

## 2017-03-12 VITALS — BP 110/95 | HR 76 | Ht 65.0 in | Wt 123.0 lb

## 2017-03-12 DIAGNOSIS — R269 Unspecified abnormalities of gait and mobility: Secondary | ICD-10-CM

## 2017-03-12 NOTE — Progress Notes (Signed)
Chief complaint: Checking orthotics, bilateral foot pain since orthotics were placed 2.5 months ago  History of present illness: Caroline Harper is a 77 year old female who presents to sports medicine office today with chief complaint of bilateral foot pain.  She does have known history of chronic right ankle pain, with MRI done in June 2018 which showed abnormal edema and cortical sclerosis in the subtalar region. Additionally, she has diffuse midfoot osteoarthritis as seen on the x-ray on 06/11/16.  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 At her last appointment here back on 12/18/16, custom orthotics were made for her.  First ray post and metatarsal cookie were placed on both sides.  This was help to prevent pronation as she does have calcaneal valgus.  Unfortunately, she reports that she started to develop diffuse toe pain as well as bilateral foot pain, moreso on the right side about a week after orthotics were made.  She has been here to the office a few subsequent times, with removal of the metatarsal cookie and removal of the first ray post.  She reports still having some pain on the lateral aspect of her right foot.  No interval injury or trauma.  No numbness, tingling, or burning paresthesias.  She is going on a [place] cruise next week.  Review of systems:  As stated above  Interval past medical history, surgical history, family history, and social history obtained and unchanged.  Physical exam: Vital signs are reviewed and are documented in the chart Gen.: Alert, oriented, appears stated age, in no apparent distress HEENT: Moist oral mucosa Respiratory: Normal respirations, able to speak in full sentences Cardiac: Regular rate, distal pulses 2+ Integumentary: No rashes on visible skin:  Neurologic: Strength 5/5, sensation 2+ in bilateral feet and ankles Psych: Normal affect, mood is described as good Musculoskeletal: Inspection of her right foot and ankle reveal  that she does have very obvious hallux valgusand hallux rigidus of first toe, but otherwise no acute bony deformity or muscle atrophyseen,no warmth, erythema, ecchymosis, or effusion, she is tender to palpation along the lateral aspect of the right foot, more so on the soft tissue rather than along the 5th MTP and metatarsal, no tenderness to palpation over medial malleolus, lateral malleolus,talus, calcaneus, Lisfranc, navicular, or base of fifth metatarsal, she does have full ankle range of motion without any elicitation of pain, anterior drawer and talar tilt negative, on foot evaluation she does have bilateral calcaneal valgus, more noticeable on the left than right, about 10 on left, 5 on right, she does have slight loss of transverse arch but no evidence of any obvious pes planus, when walking she is noticed to supinate on both sides, but it is more pronounced on the right side  Assessment and plan: 1. Gait abnormality, with supination pattern and calcaneal valgus 2. Bilateral hallux rigidus 3. Bilateral hallux valgus  Plan: With her custom orthotics, fifth ray post was applied to help give her some cushion as she is supinating more.  She did try this on and ambulated in the hallway.  This did help relieve the pain and the pressure on her right foot.  This was also fitted to her left foot.  She is very pleased with this.  Hopefully this will do the trick.  She will follow-up here on as-needed basis.   Mort Sawyers, M.D. [place]

## 2017-03-15 ENCOUNTER — Ambulatory Visit
Admission: RE | Admit: 2017-03-15 | Discharge: 2017-03-15 | Disposition: A | Discharge status: Home / Self Care | Payer: Medicare Other | Source: Ambulatory Visit | Attending: Geriatric Medicine | Admitting: Geriatric Medicine

## 2017-03-15 DIAGNOSIS — Z1231 Encounter for screening mammogram for malignant neoplasm of breast: Secondary | ICD-10-CM

## 2017-03-17 ENCOUNTER — Emergency Department (HOSPITAL_BASED_OUTPATIENT_CLINIC_OR_DEPARTMENT_OTHER): Payer: Medicare Other

## 2017-03-17 ENCOUNTER — Encounter (HOSPITAL_BASED_OUTPATIENT_CLINIC_OR_DEPARTMENT_OTHER): Payer: Self-pay | Admitting: *Deleted

## 2017-03-17 ENCOUNTER — Emergency Department (HOSPITAL_BASED_OUTPATIENT_CLINIC_OR_DEPARTMENT_OTHER)
Admission: EM | Admit: 2017-03-17 | Discharge: 2017-03-17 | Disposition: A | Discharge status: Home / Self Care | Payer: Medicare Other | Attending: Emergency Medicine | Admitting: Emergency Medicine

## 2017-03-17 ENCOUNTER — Other Ambulatory Visit: Payer: Self-pay

## 2017-03-17 DIAGNOSIS — S96911A Strain of unspecified muscle and tendon at ankle and foot level, right foot, initial encounter: Secondary | ICD-10-CM | POA: Insufficient documentation

## 2017-03-17 DIAGNOSIS — S86911A Strain of unspecified muscle(s) and tendon(s) at lower leg level, right leg, initial encounter: Secondary | ICD-10-CM | POA: Insufficient documentation

## 2017-03-17 DIAGNOSIS — Y92000 Kitchen of unspecified non-institutional (private) residence as  the place of occurrence of the external cause: Secondary | ICD-10-CM | POA: Diagnosis not present

## 2017-03-17 DIAGNOSIS — Y998 Other external cause status: Secondary | ICD-10-CM | POA: Insufficient documentation

## 2017-03-17 DIAGNOSIS — Y9389 Activity, other specified: Secondary | ICD-10-CM | POA: Insufficient documentation

## 2017-03-17 DIAGNOSIS — S8991XA Unspecified injury of right lower leg, initial encounter: Secondary | ICD-10-CM | POA: Diagnosis present

## 2017-03-17 DIAGNOSIS — Z79899 Other long term (current) drug therapy: Secondary | ICD-10-CM | POA: Diagnosis not present

## 2017-03-17 DIAGNOSIS — W010XXA Fall on same level from slipping, tripping and stumbling without subsequent striking against object, initial encounter: Secondary | ICD-10-CM | POA: Diagnosis not present

## 2017-03-17 DIAGNOSIS — Z9049 Acquired absence of other specified parts of digestive tract: Secondary | ICD-10-CM | POA: Diagnosis not present

## 2017-03-17 DIAGNOSIS — Z853 Personal history of malignant neoplasm of breast: Secondary | ICD-10-CM | POA: Diagnosis not present

## 2017-03-17 MED ORDER — IBUPROFEN 400 MG PO TABS
600.0000 mg | ORAL_TABLET | Freq: Once | ORAL | Status: AC
  Administered 2017-03-17: 600 mg via ORAL
  Filled 2017-03-17: qty 1

## 2017-03-17 MED ORDER — ACETAMINOPHEN 325 MG PO TABS
650.0000 mg | ORAL_TABLET | Freq: Once | ORAL | Status: AC
  Administered 2017-03-17: 650 mg via ORAL
  Filled 2017-03-17: qty 2

## 2017-03-17 NOTE — ED Provider Notes (Signed)
[facility] EMERGENCY DEPARTMENT Provider Note   CSN: [id number] Arrival date & time: 03/17/17  1618     History   Chief Complaint Chief Complaint  Patient presents with  . Fall    HPI Caroline Harper is a 78 y.o. female.  Pt presents to the ED today with right knee and right ankle pain.  Pt said she fell when getting up from the kitchen table this morning.  She said her right leg gave out.  It was feeling better, but then she had lunch with some friends.  She sat at the table for a few hours.  When she tried to get up, she could not put any weight on her knee.  She said it feels swollen.   She took tylenol at 1000.  She did not hit her head or have a loc.      Past Medical History:  Diagnosis Date  . Cancer [facility]) 04-28-13   '04-left Breast-lumpectomy,radiation ,oral med.-Dr. Jana Hakim follows.  Marland Kitchen History of kidney stones    x1   . Osteoporosis, unspecified 06/05/2012    Patient Active Problem List   Diagnosis Date Noted  . Osteoporosis, unspecified 06/05/2012  . Breast cancer ([place]) 05/30/2011    Past Surgical History:  Procedure Laterality Date  . BACK SURGERY     Complete spinal fusion(hx. scoliosis)  . BREAST SURGERY Left    '04-lumpectomy.  Lorin Mercy  04-28-13   3'08- laparoscopic -  . COLONOSCOPY     past hx, x1 - no significant findings  . COLONOSCOPY WITH PROPOFOL N/A 05/12/2013   Procedure: COLONOSCOPY WITH PROPOFOL;  Surgeon: Garlan Fair, MD;  Location: [facility];  Service: Endoscopy;  Laterality: N/A;  . HYSTEROSCOPY W/D&C N/A 10/05/2013   Procedure: DILATATION AND CURETTAGE /HYSTEROSCOPY;  Surgeon: Melina Schools, MD;  Location: [facility];  Service: Gynecology;  Laterality: N/A;  . POLYPECTOMY N/A 10/05/2013   Procedure: POLYPECTOMY;  Surgeon: Melina Schools, MD;  Location: [facility];  Service: Gynecology;  Laterality: N/A;  . SPINAL FUSION     as teenager  . TUBAL LIGATION      OB History    No data available       Home  Medications    Prior to Admission medications   Medication Sig Start Date End Date Taking? Authorizing Provider  acetaminophen (TYLENOL) 500 MG tablet Take 500 mg by mouth every 6 (six) hours as needed for moderate pain.    [provider]  BIOTIN PO Take 10,000 mcg by mouth daily.     [provider]  calcium-vitamin D (OSCAL WITH D) 500-200 MG-UNIT per tablet Take 1 tablet by mouth 2 (two) times daily.    [provider]  denosumab (PROLIA) 60 MG/ML SOLN injection Inject 60 mg into the skin every 6 (six) months. Administer in upper arm, thigh, or abdomen    [provider]  fish oil-omega-3 fatty acids 1000 MG capsule Take 1 g by mouth daily.    [provider]  ibuprofen (ADVIL,MOTRIN) 600 MG tablet Take 1 tablet (600 mg total) by mouth every 6 (six) hours as needed for moderate pain. 02/01/17   Domenic Moras, PA-C  Multiple Vitamin (MULTIVITAMIN) tablet Take 1 tablet by mouth daily.    [provider]  tamsulosin (FLOMAX) 0.4 MG CAPS capsule Take 1 capsule (0.4 mg total) by mouth daily after supper. 11/13/16   Blanchie Dessert, MD  traMADol (ULTRAM) 50 MG tablet Take 1 tablet (50 mg  total) by mouth every 6 (six) hours as needed. 11/13/16   Blanchie Dessert, MD  vitamin C (ASCORBIC ACID) 500 MG tablet Take 500 mg by mouth daily.    [provider]    Family History No family history on file.  Social History Social History   Tobacco Use  . Smoking status: Never Smoker  . Smokeless tobacco: Never Used  Substance Use Topics  . Alcohol use: Yes    Alcohol/week: 0.6 oz    Types: 1 Glasses of wine per week    Comment: wine daily at dinner  . Drug use: No     Allergies   Patient has no known allergies.   Review of Systems Review of Systems  Musculoskeletal:       Right knee, right ankle pain  All other systems reviewed and are negative.    Physical Exam Updated Vital Signs BP 115/63   Pulse 84   Temp 98.5 F  (36.9 C) (Oral)   Resp 18   Ht 5\' 5"  (1.651 m)   Wt 55.8 kg (123 lb)   SpO2 98%   BMI 20.47 kg/m   Physical Exam  Constitutional: She is oriented to person, place, and time. She appears well-developed and well-nourished.  HENT:  Head: Normocephalic and atraumatic.  Right Ear: External ear normal.  Left Ear: External ear normal.  Nose: Nose normal.  Mouth/Throat: Oropharynx is clear and moist.  Eyes: Conjunctivae and EOM are normal. Pupils are equal, round, and reactive to light.  Neck: Normal range of motion. Neck supple.  Cardiovascular: Normal rate, regular rhythm, normal heart sounds and intact distal pulses.  Pulmonary/Chest: Effort normal and breath sounds normal.  Abdominal: Soft. Bowel sounds are normal.  Musculoskeletal: Normal range of motion.       Right knee: She exhibits swelling. Tenderness found. Lateral joint line tenderness noted.       Right ankle: Tenderness. Lateral malleolus tenderness found.  Neurological: She is alert and oriented to person, place, and time.  Skin: Skin is warm. Capillary refill takes less than 2 seconds.  Psychiatric: She has a normal mood and affect. Her behavior is normal. Judgment and thought content normal.  Nursing note and vitals reviewed.    ED Treatments / Results  Labs (all labs ordered are listed, but only abnormal results are displayed) Labs Reviewed - No data to display  EKG  EKG Interpretation None       Radiology Dg Ankle Complete Right  Result Date: 03/17/2017 CLINICAL DATA:  States she stood up out of a chair this morning and her Rt leg gave out. She feels her leg has gone to sleep. Has pain in lateral Rt knee and ankle. Unable to bear weight. Hx Rt knee surgery due to scoliosis EXAM: RIGHT ANKLE - COMPLETE 3+ VIEW COMPARISON:  12/05/2016 FINDINGS: There is no evidence of fracture, dislocation, or joint effusion. There is no evidence of arthropathy or other focal bone abnormality. Soft tissues are unremarkable.  IMPRESSION: Negative. Electronically Signed   By: Nolon Nations M.D.   On: 03/17/2017 17:39   Dg Knee Complete 4 Views Right  Result Date: 03/17/2017 CLINICAL DATA:  States she stood up out of a chair this morning and her Rt leg gave out. She feels her leg has gone to sleep. Has pain in lateral Rt knee and ankle. Unable to bear weight. Hx Rt knee surgery due to scoliosis EXAM: RIGHT KNEE - COMPLETE 4+ VIEW COMPARISON:  None. FINDINGS: Postoperative changes in the  distal femur. Bones appear radiolucent. There is a moderate joint effusion. No acute fracture or subluxation. There is mild narrowing of the medial, lateral, and patellofemoral compartments. IMPRESSION: 1. Moderate joint effusion. 2. Mild degenerative changes. Electronically Signed   By: Nolon Nations M.D.   On: 03/17/2017 17:38    Procedures Procedures (including critical care time)  Medications Ordered in ED Medications  ibuprofen (ADVIL,MOTRIN) tablet 600 mg (600 mg Oral Given 03/17/17 1732)  acetaminophen (TYLENOL) tablet 650 mg (650 mg Oral Given 03/17/17 1732)     Initial Impression / Assessment and Plan / ED Course  I have reviewed the triage vital signs and the nursing notes.  Pertinent labs & imaging results that were available during my care of the patient were reviewed by me and considered in my medical decision making (see chart for details).    Pt to be placed in a knee immobilizer.  She has a cane at home and is given a rx for a walker.  She knows to return if worse.  F/u with ortho.  Final Clinical Impressions(s) / ED Diagnoses   Final diagnoses:  Strain of right knee, initial encounter  Strain of right ankle, initial encounter    ED Discharge Orders        Ordered    Walker rolling     03/17/17 1755       Isla Pence, MD 03/17/17 1756

## 2017-03-17 NOTE — ED Triage Notes (Signed)
Pt reports she was sitting and when she got up her right leg gave away and she fell. Thinks her foot may have gone to sleep. Pt states she iced her ankle and sat down for lunch but when she got up her knee was painful

## 2017-03-17 NOTE — ED Notes (Signed)
Patient transported to X-ray 

## 2017-03-18 ENCOUNTER — Encounter (INDEPENDENT_AMBULATORY_CARE_PROVIDER_SITE_OTHER): Payer: Self-pay | Admitting: Orthopaedic Surgery

## 2017-03-18 ENCOUNTER — Other Ambulatory Visit (INDEPENDENT_AMBULATORY_CARE_PROVIDER_SITE_OTHER): Payer: Self-pay

## 2017-03-18 ENCOUNTER — Other Ambulatory Visit (INDEPENDENT_AMBULATORY_CARE_PROVIDER_SITE_OTHER): Payer: Self-pay | Admitting: *Deleted

## 2017-03-18 ENCOUNTER — Ambulatory Visit (INDEPENDENT_AMBULATORY_CARE_PROVIDER_SITE_OTHER): Payer: Medicare Other | Admitting: Orthopaedic Surgery

## 2017-03-18 VITALS — BP 107/62 | HR 87 | Resp 12 | Ht 65.0 in | Wt 120.0 lb

## 2017-03-18 DIAGNOSIS — M25561 Pain in right knee: Secondary | ICD-10-CM

## 2017-03-18 DIAGNOSIS — M25562 Pain in left knee: Principal | ICD-10-CM

## 2017-03-18 DIAGNOSIS — G8929 Other chronic pain: Secondary | ICD-10-CM

## 2017-03-18 MED ORDER — METHYLPREDNISOLONE ACETATE 40 MG/ML IJ SUSP
80.0000 mg | INTRAMUSCULAR | Status: AC | PRN
  Administered 2017-03-18: 80 mg

## 2017-03-18 MED ORDER — LIDOCAINE HCL 1 % IJ SOLN
2.0000 mL | INTRAMUSCULAR | Status: AC | PRN
  Administered 2017-03-18: 2 mL

## 2017-03-18 NOTE — Progress Notes (Signed)
Office Visit Note   Patient: Caroline Harper           Date of Birth: [birth year]           MRN: [id number] Visit Date: 03/18/2017              Requested by: Lajean Manes, MD [address] [place], [place] [address] PCP: Lajean Manes, MD   Assessment & Plan: Visit Diagnoses:  1. Acute pain of right knee     Plan: Caroline Harper fells several nights ago after which she felt was her "right foot falling asleep. She was having pain in her ankle and her knee went to the emergency room. Films of the right knee and ankle were negative. She was placed in a knee immobilizer and asked to follow-up in the office today. She and her husband are planning a 2 week trip at the end of the week. Knee aspirate today was consistent with a mild hemarthrosis. I have injected cortisone and applied a spider brace. We'll plan to see her back when she returns from her trip  Follow-Up Instructions: Return if symptoms worsen or fail to improve.   Orders:  Orders Placed This Encounter  Procedures  . Large Joint Inj: R knee   No orders of the defined types were placed in this encounter.     Procedures: Large Joint Inj: R knee on 03/18/2017 2:28 PM Indications: pain and diagnostic evaluation Details: 22 G 1.5 in needle, anteromedial approach  Arthrogram: No  Medications: 2 mL lidocaine 1 %; 80 mg methylPREDNISolone acetate 40 MG/ML Aspirate: 30 mL clear and blood-tinged; sent for lab analysis Procedure, treatment alternatives, risks and benefits explained, specific risks discussed. Consent was given by the patient. Immediately prior to procedure a time out was called to verify the correct patient, procedure, equipment, support staff and site/side marked as required. Patient was prepped and draped in the usual sterile fashion.       Clinical Data: No additional findings.   Subjective: Chief Complaint  Patient presents with  . Right Knee - Pain, Injury, Edema    Caroline Harper is a 77 y o  here today with right knee and right ankle pain.  Pt said she fell when getting up from the kitchen table this morning.  She said her right leg gave out.  It was feeling better, but then she had lunch with some friends.    . Right Ankle - Pain, Edema, Injury    She sat at the table for a few hours.  When she tried to get up, she could not put any weight on her knee.  She said it feels swollen.   She took tylenol at 1000.  She did not hit her head or have a loc. Uses a walker  Knee immobilizer applied the emergency room and feeling better. No pain prior to this fall. Ankle is feeling better but knee is still painful. Had prior right knee surgery many years ago with what appears to to be  An epiphysis stapling. I did review the films of the PACS system and there does not appear to be a fracture about the right ankle or right knee. Maybe some mild degenerative changes in both medial lateral compartment right knee  HPI  Review of Systems  Constitutional: Negative for chills, fatigue and fever.  Eyes: Negative for itching.  Respiratory: Negative for chest tightness and shortness of breath.   Cardiovascular: Negative for chest pain, palpitations and leg  swelling.  Gastrointestinal: Negative for blood in stool, constipation and diarrhea.  Endocrine: Negative for polyuria.  Genitourinary: Negative for dysuria.  Musculoskeletal: Negative for back pain, joint swelling, neck pain and neck stiffness.  Allergic/Immunologic: Negative for immunocompromised state.  Neurological: Negative for dizziness and numbness.  Hematological: Does not bruise/bleed easily.  Psychiatric/Behavioral: The patient is not nervous/anxious.      Objective: Vital Signs: BP 107/62   Pulse 87   Resp 12   Ht 5\' 5"  (1.651 m)   Wt 120 lb (54.4 kg)   BMI 19.97 kg/m   Physical Exam  Ortho Exam positive effusion right knee. No instability. Minimal medial lateral joint tenderness. Full extension. Some patellar crepitation no  obvious instability. No calf pain. Minimal tenderness in the dorsum of the mid foot without ecchymosis or erythema. Skin otherwise intact neurovascular exam intact Specialty Comments:  No specialty comments available.  Imaging: Dg Ankle Complete Right  Result Date: 03/17/2017 CLINICAL DATA:  States she stood up out of a chair this morning and her Rt leg gave out. She feels her leg has gone to sleep. Has pain in lateral Rt knee and ankle. Unable to bear weight. Hx Rt knee surgery due to scoliosis EXAM: RIGHT ANKLE - COMPLETE 3+ VIEW COMPARISON:  12/05/2016 FINDINGS: There is no evidence of fracture, dislocation, or joint effusion. There is no evidence of arthropathy or other focal bone abnormality. Soft tissues are unremarkable. IMPRESSION: Negative. Electronically Signed   By: Nolon Nations M.D.   On: 03/17/2017 17:39   Dg Knee Complete 4 Views Right  Result Date: 03/17/2017 CLINICAL DATA:  States she stood up out of a chair this morning and her Rt leg gave out. She feels her leg has gone to sleep. Has pain in lateral Rt knee and ankle. Unable to bear weight. Hx Rt knee surgery due to scoliosis EXAM: RIGHT KNEE - COMPLETE 4+ VIEW COMPARISON:  None. FINDINGS: Postoperative changes in the distal femur. Bones appear radiolucent. There is a moderate joint effusion. No acute fracture or subluxation. There is mild narrowing of the medial, lateral, and patellofemoral compartments. IMPRESSION: 1. Moderate joint effusion. 2. Mild degenerative changes. Electronically Signed   By: Nolon Nations M.D.   On: 03/17/2017 17:38     PMFS History: Patient Active Problem List   Diagnosis Date Noted  . Osteoporosis, unspecified 06/05/2012  . Breast cancer ([place]) 05/30/2011   Past Medical History:  Diagnosis Date  . Cancer [facility]) 04-28-13   '04-left Breast-lumpectomy,radiation ,oral med.-Dr. Jana Hakim follows.  Marland Kitchen History of kidney stones    x1   . Osteoporosis, unspecified 06/05/2012    History reviewed. No  pertinent family history.  Past Surgical History:  Procedure Laterality Date  . BACK SURGERY     Complete spinal fusion(hx. scoliosis)  . BREAST SURGERY Left    '04-lumpectomy.  [facility]  04-28-13   3'08- laparoscopic -  . COLONOSCOPY     past hx, x1 - no significant findings  . COLONOSCOPY WITH PROPOFOL N/A 05/12/2013   Procedure: COLONOSCOPY WITH PROPOFOL;  Surgeon: Garlan Fair, MD;  Location: [facility];  Service: Endoscopy;  Laterality: N/A;  . HYSTEROSCOPY W/D&C N/A 10/05/2013   Procedure: DILATATION AND CURETTAGE /HYSTEROSCOPY;  Surgeon: Melina Schools, MD;  Location: [facility];  Service: Gynecology;  Laterality: N/A;  . POLYPECTOMY N/A 10/05/2013   Procedure: POLYPECTOMY;  Surgeon: Melina Schools, MD;  Location: [facility];  Service: Gynecology;  Laterality: N/A;  . SPINAL FUSION  as teenager  . TUBAL LIGATION     Social History   Occupational History  . Not on file  Tobacco Use  . Smoking status: Never Smoker  . Smokeless tobacco: Never Used  Substance and Sexual Activity  . Alcohol use: Yes    Alcohol/week: 0.6 oz    Types: 1 Glasses of wine per week    Comment: wine daily at dinner  . Drug use: No  . Sexual activity: Not on file

## 2017-03-19 LAB — SYNOVIAL CELL COUNT + DIFF, W/ CRYSTALS
Basophils, %: 0 %
EOSINOPHILS-SYNOVIAL: 0 % (ref 0–2)
LYMPHOCYTES-SYNOVIAL FLD: 1 % (ref 0–74)
MONOCYTE/MACROPHAGE: 3 % (ref 0–69)
NEUTROPHIL, SYNOVIAL: 96 % — AB (ref 0–24)
Synoviocytes, %: 0 % (ref 0–15)
WBC, Synovial: 176 cells/uL — ABNORMAL HIGH (ref <150)

## 2017-03-19 LAB — GRAM STAIN
MICRO NUMBER:: 90153133
SPECIMEN QUALITY:: ADEQUATE

## 2017-03-19 LAB — PROTEIN, SYNOVIAL FLUID: PROTEIN, TOTAL, SYNOVIAL FLUID: 3.4 g/dL — ABNORMAL HIGH (ref 1.0–3.0)

## 2017-03-19 LAB — GLUCOSE, SYNOVIAL FLUID: GLUCOSE, SYNOVIAL FLUID: 181 mg/dL

## 2017-03-24 LAB — ANAEROBIC AND AEROBIC CULTURE
AER RESULT: NO GROWTH
MICRO NUMBER:: 90152658
MICRO NUMBER:: 90152764
SPECIMEN QUALITY: ADEQUATE
SPECIMEN QUALITY:: ADEQUATE

## 2017-05-21 ENCOUNTER — Encounter: Payer: Self-pay | Admitting: Sports Medicine

## 2017-05-21 ENCOUNTER — Ambulatory Visit: Payer: Medicare Other | Admitting: Sports Medicine

## 2017-05-21 DIAGNOSIS — M216X1 Other acquired deformities of right foot: Secondary | ICD-10-CM

## 2017-05-21 DIAGNOSIS — M21612 Bunion of left foot: Secondary | ICD-10-CM | POA: Diagnosis not present

## 2017-05-21 DIAGNOSIS — M21611 Bunion of right foot: Secondary | ICD-10-CM | POA: Diagnosis not present

## 2017-05-21 NOTE — Assessment & Plan Note (Addendum)
Patient presents with right ankle pain in the setting of right foot deformity from hallux valgus. Patient has significant supination with ambulation due to inability to flex her great toe. Therefore she has been compensating by using 4th and 5th toe for balance and proprioception. On ambulation, noticeable right foot supination putting additional stress on ankle. Additional lateral support/cushioning was provided on the heel of her custom right orthotic to provide additional pronation which should provide stress relief of her right ankle. Patient will use it for the next month if ankle pain improve she will return to clinic and will make a new set of orthotics.

## 2017-05-21 NOTE — Progress Notes (Signed)
Subjective:    Patient ID: Caroline Harper, female    DOB: 1940/07/16, 77 y.o.   MRN: [id number]   CC: R ankle pain   HPI: Patient is a 77 yo female who presents today complaining of right ankle pain. Patient reports she has a history of chronic right ankle pain that was secondary to increase supination while ambulating due to foot deformation from her big toe bunion. Patient had orthotic made to correct alignment and she had significant improvement in her ankle pain, however she started to develop big toe pain and callus formation due to overcorrection of her supination. Patient was seen again with some modification to her orthotic which help resolve big toe pain and calluses however it has since caused recurrence of her right ankle pain. Patient denies any swelling but does endorse significant pain with ambulation forcing her to curtail her daily walking schedule with her husband. Patient is about to relocate to their summer house in June and will be doing more walking and wanted have ankle reevaluated.  Smoking status reviewed   ROS: all other systems were reviewed and are negative other than in the HPI   Past Medical History:  Diagnosis Date  . Cancer [facility]) 04-28-13   '04-left Breast-lumpectomy,radiation ,oral med.-Dr. Jana Hakim follows.  Marland Kitchen History of kidney stones    x1   . Osteoporosis, unspecified 06/05/2012    Past Surgical History:  Procedure Laterality Date  . BACK SURGERY     Complete spinal fusion(hx. scoliosis)  . BREAST SURGERY Left    '04-lumpectomy.  [facility]  04-28-13   3'08- laparoscopic -  . COLONOSCOPY     past hx, x1 - no significant findings  . COLONOSCOPY WITH PROPOFOL N/A 05/12/2013   Procedure: COLONOSCOPY WITH PROPOFOL;  Surgeon: Garlan Fair, MD;  Location: [facility];  Service: Endoscopy;  Laterality: N/A;  . HYSTEROSCOPY W/D&C N/A 10/05/2013   Procedure: DILATATION AND CURETTAGE /HYSTEROSCOPY;  Surgeon: Melina Schools, MD;  Location: [facility];   Service: Gynecology;  Laterality: N/A;  . POLYPECTOMY N/A 10/05/2013   Procedure: POLYPECTOMY;  Surgeon: Melina Schools, MD;  Location: [facility];  Service: Gynecology;  Laterality: N/A;  . SPINAL FUSION     as teenager  . TUBAL LIGATION      Past medical history, surgical, family, and social history reviewed and updated in the EMR as appropriate.  Objective:  BP 130/84   Ht 5\' 5"  (1.651 m)   Wt 120 lb (54.4 kg)   BMI 19.97 kg/m   Vitals and nursing note reviewed  General: NAD, pleasant, able to participate in exam Right ankle exam: no swelling, effusion, noted on exam. Significant hallux valgus with partial covering of the second toe. Flattening of transverse arch. Noted significant bony spur around first metatarsal. No plantar flexion of RT great toe at MTP but has extension. Strength is 5/5, no joint laxity noted. Pulses palpated, with good perfusion and warmth.   Left significant bunion with valgus deviation Less cross over of 2nd toe This MTP shows good dorsal and plantar flexion  Moderate supination noted noted during gait exam on RT in spite of lateral ray post on orthotic  Assessment & Plan:    Acquired supination of right foot Patient presents with right ankle pain in the setting of right foot deformity from hallux valgus. Patient has significant supination with ambulation due to inability to flex her great toe. Therefore she has been compensating by using 4th and 5th toe for  balance and proprioception. On ambulation, noticeable right foot supination putting additional stress on ankle. Additional lateral support/cushioning was provided on the heel of her custom right orthotic to provide additional pronation which should provide stress relief of her right ankle. Patient will use it for the next month if ankle pain improve she will return to clinic and will make a new set of orthotics.   Marjie Skiff, MD [facility] PGY-2  I observed and examined the  patient with the resident and agree with assessment and plan.  Note reviewed and modified by me. Caroline Libel, MD

## 2017-05-21 NOTE — Assessment & Plan Note (Signed)
She needs orthotics  However RT bunion is severe and dysfunctional May need to consider trying surgery

## 2017-06-17 ENCOUNTER — Ambulatory Visit: Payer: Self-pay

## 2017-06-17 ENCOUNTER — Ambulatory Visit: Payer: Medicare Other | Admitting: Sports Medicine

## 2017-06-17 ENCOUNTER — Encounter: Payer: Self-pay | Admitting: Sports Medicine

## 2017-06-17 VITALS — BP 144/70 | Ht 65.0 in | Wt 122.0 lb

## 2017-06-17 DIAGNOSIS — M25571 Pain in right ankle and joints of right foot: Secondary | ICD-10-CM | POA: Diagnosis not present

## 2017-06-17 DIAGNOSIS — G8929 Other chronic pain: Secondary | ICD-10-CM | POA: Diagnosis not present

## 2017-06-17 MED ORDER — METHYLPREDNISOLONE ACETATE 40 MG/ML IJ SUSP
20.0000 mg | Freq: Once | INTRAMUSCULAR | Status: AC
  Administered 2017-06-17: 20 mg via INTRA_ARTICULAR

## 2017-06-17 NOTE — Progress Notes (Signed)
CC; RT ankle pain  Walks regularly Worse ankle pain over past year Has known bunions and foot breakdown Hx of osteoporosis and wants to keep walking  Developed ankle pain about 1 year ago Orthopedics kept her in an ankle brace lace up for about 6 months but had no benefit We placed her in custom orthotics but that did not resolve the pain  X-ray of the right ankle last month did not show arthritis  Past hx Breast cancer Osteoporosis Kidney stones Bunions  Review of systems No significant ankle swelling No swelling in the first MTP joint or other symptoms of gout No swelling in other joints  Physical examination Pleasant female in no acute distress BP (!) 144/70   Ht 5\' 5"  (1.651 m)   Wt 122 lb (55.3 kg)   BMI 20.30 kg/m   Right ankle reveals some tenderness to palpation just anterior to the lateral malleolus The sinus tarsi is tender but no real swelling Good range of motion on inversion dorsiflexion and plantarflexion Significant bunions bilaterally with hallux valgus All ligaments are stable No tenderness over the base of the fifth metatarsal, the calcaneus or the navicular  Ultrasound of the right ankle The medial malleolus looks normal Medial ankle joint without swelling  fibula and lateral malleolus look normal Peroneal tendons look normal Medial talar dome normal Lateral talar dome with a small chip about 2 to 3 mm surrounded by hypoechoic swelling This is PMT  Impression: Lateral talar dome chip fracture  Ultrasound and interpretation by Wolfgang Phoenix. Jamarco Zaldivar, MD  Procedure:  Injection of small loose body at lateral talar dome Consent obtained and verified. Time-out conducted. Noted no overlying erythema, induration, or other signs of local infection. Skin prepped in a sterile fashion. Topical analgesic spray: Ethyl chloride. Completed without difficulty.  I identified the location of the chip and surrounding fluid with ultrasound and used this to guide  injection. Meds: 1/2 cc solumedrol + 1/2 cc lidocaine 1% Pain immediately improved suggesting accurate placement of the medication. Advised to call if fevers/chills, erythema, induration, drainage, or persistent bleeding.  Ila Mcgill, MD

## 2017-06-17 NOTE — Assessment & Plan Note (Signed)
Trial with CSI Rest for 3 days Gradually ease into walking Cont. To use orthotic support  Reck pending response

## 2017-07-16 ENCOUNTER — Encounter: Payer: Self-pay | Admitting: Sports Medicine

## 2017-07-16 ENCOUNTER — Ambulatory Visit: Payer: Medicare Other | Admitting: Sports Medicine

## 2017-07-16 DIAGNOSIS — M21612 Bunion of left foot: Secondary | ICD-10-CM

## 2017-07-16 DIAGNOSIS — G8929 Other chronic pain: Secondary | ICD-10-CM

## 2017-07-16 DIAGNOSIS — M25571 Pain in right ankle and joints of right foot: Secondary | ICD-10-CM

## 2017-07-16 DIAGNOSIS — M21611 Bunion of right foot: Secondary | ICD-10-CM | POA: Diagnosis not present

## 2017-07-16 NOTE — Progress Notes (Signed)
   Subjective:    Patient ID: Caroline Harper, female    DOB: October 24, 1940, 77 y.o.   MRN: [id number]  HPI Pt presents for re-eval of R foot and ankle pain s/p R anterior ankle injxn at last visit. She feels like the steroid injxn made no difference at all. Her R great toe has pronounced lateral deviation due to a bunion and is no longer contacting the ground which is now causing her balance problems. She also reports numbness in all of her R toes for the past 2 months. She used to walk 2-3 miles every morning with her husband and has been unable to do so for the past year due to pain in her R lateral ankle with prolonged walking.    Review of Systems  No relief with ankle brace Orthotics helps some No significant swelling of ankle or foot    Objective:   Physical Exam Seated on exam table in NAD R ankle looks WNL, skin NL color and temperature R ankle is NTTP NL ROM NL strength 5/5 Ankle is stable NVI with 2/2 R DP pulses  R great toe is significantly laterally deviated due to a bunion and partially overlapping the 2nd toe which elevates the great toe's plantar surface.  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 Caroline Case, MD     Assessment & Plan:  Pt feels that she got no benefit from the steroid injection and is at the point where she would benefit from bunion surgery, which is reasonable. Given a referral to be seen in close f/u for eval but pt would rather wait until fall as she has a busy summer planned. She is to continue to wear her orthotics. Activity as tolerated. OTC  pain meds if needed. She will benefit form icing when having pain. Return as needed. F/u with foot and ankle specialist for surgical eval.

## 2017-07-16 NOTE — Assessment & Plan Note (Signed)
Symptoms on Right are worse She is willing to consider surgery Referral to Dr Doran Durand for his advice

## 2017-07-16 NOTE — Assessment & Plan Note (Signed)
Injection of small OCD lesion of lateral talar dome did not help sxs. Still TTP However, is unclear if ankle pain relates to pronated gait and bunion issues  Consult with Dr Doran Durand

## 2017-07-19 ENCOUNTER — Encounter: Payer: Self-pay | Admitting: *Deleted

## 2017-07-19 NOTE — Progress Notes (Signed)
[facility] Orthopedics Dr Doran Durand 08/23/17 @ [place] [address]

## 2017-10-08 IMAGING — MR MR FOOT*R* W/O CM
4 of 5 series · 15 of 40 positions shown · non-contrast
Comparison: None.

ADDENDUM:
CORRECTION: The abnormality is of the subcortical region of the
LATERAL ASPECT OF THE POSTERIOR subtalar facet of the calcaneus.
CLINICAL DATA: Right foot pain for 3 months.

EXAM:
MRI OF THE RIGHT MID AND HINDFOOT WITHOUT CONTRAST
TECHNIQUE: Multiplanar, multisequence MR imaging of the hindfoot and midfoot
was performed. No intravenous contrast was administered.

[Series 4: PD fat-sat · axial · 4.0mm · 0.25mm/px · z∈[-86,+43]mm · 6 of 33 slices shown]
[im 1/33]
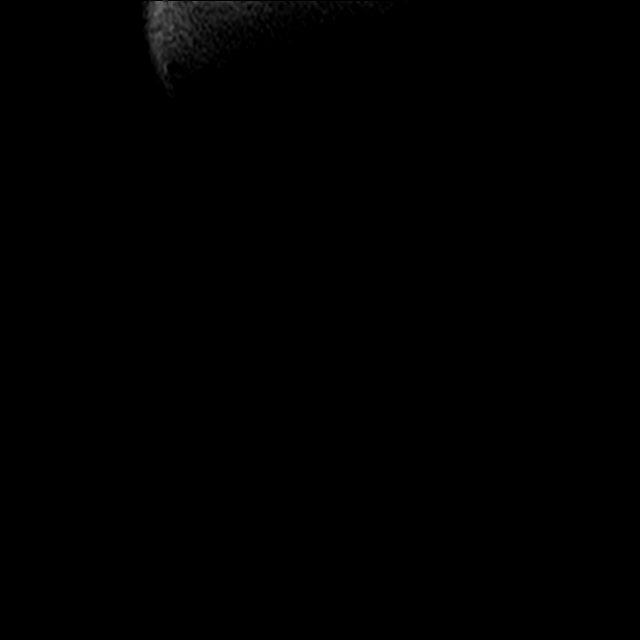
[im 4/33]
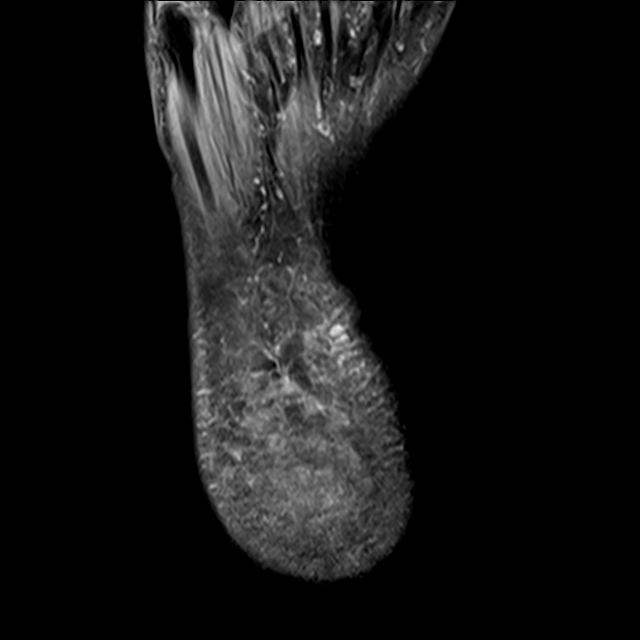
[im 11/33]
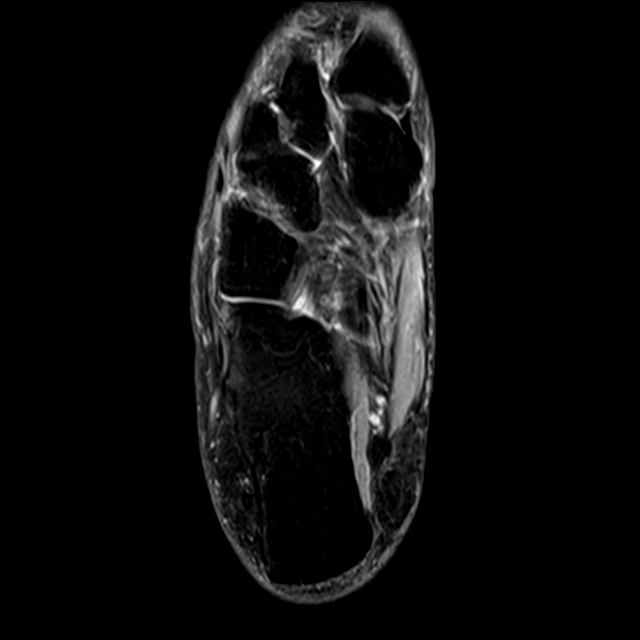
[im 15/33]
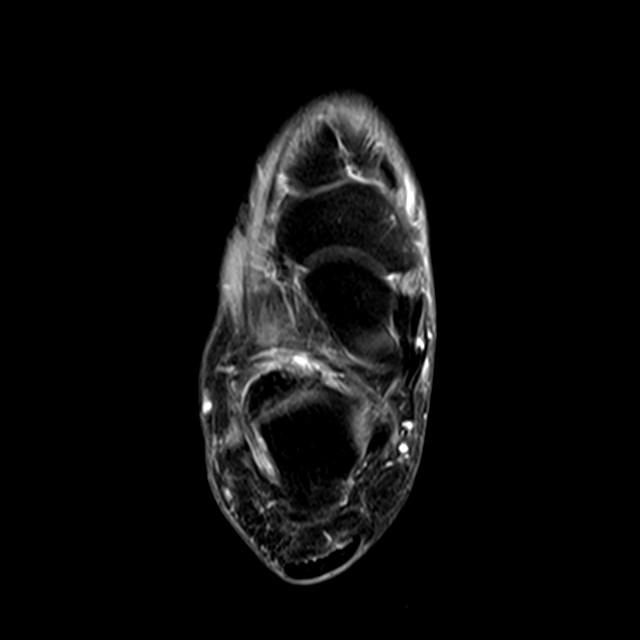
[im 18/33]
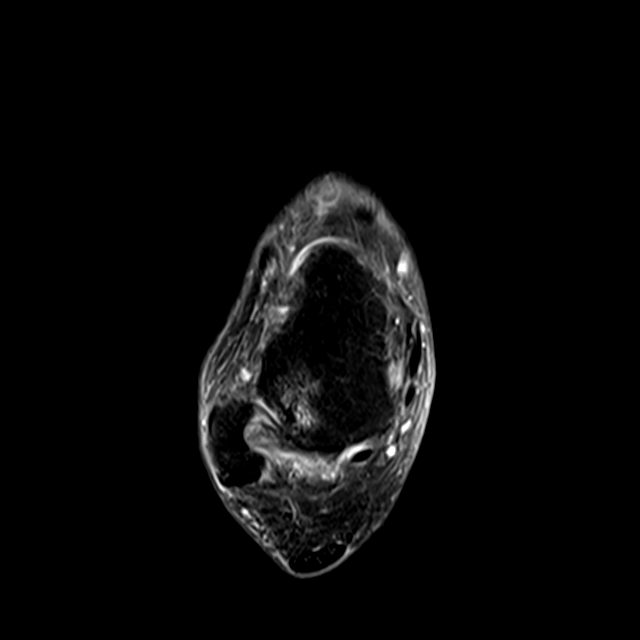
[im 29/33]
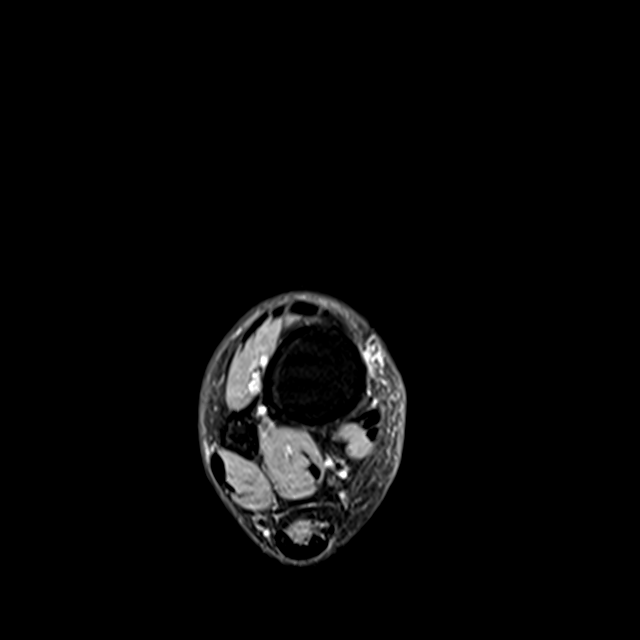

[Series 5: T2 fat-sat · axial · 4.0mm · 0.28mm/px · z∈[-72,+43]mm · 3 of 33 slices shown (1 of 3)]
[im 4/33]
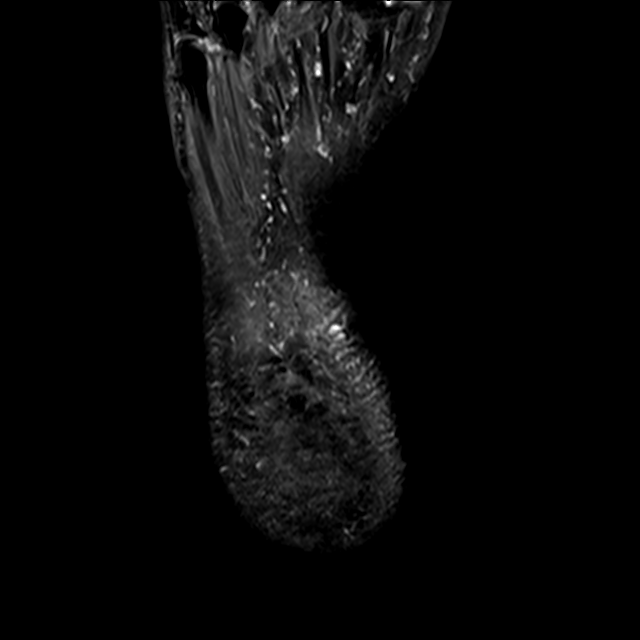
[im 18/33]
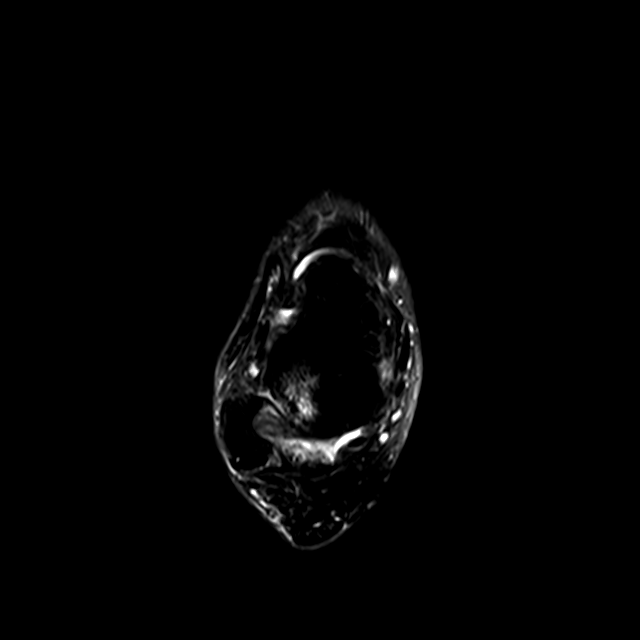
[im 29/33]
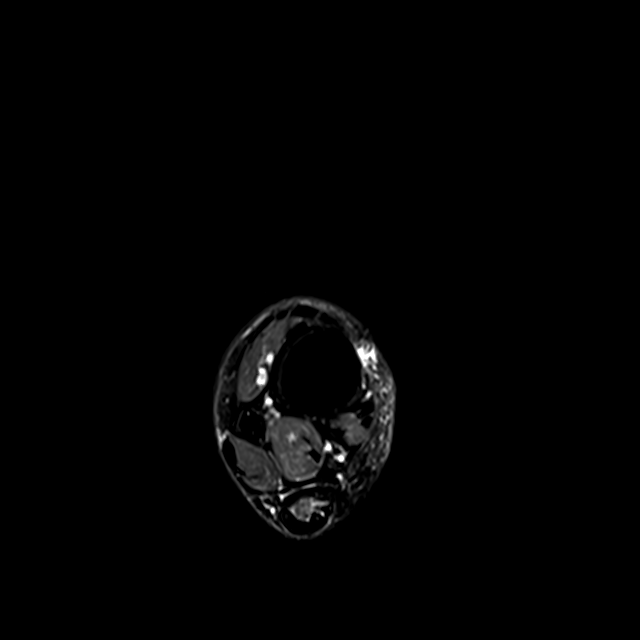

[Series 7: T2 fat-sat · coronal · 3.5mm · 0.35mm/px · 3 of 36 slices shown (2 of 3)]
[im 4/36]
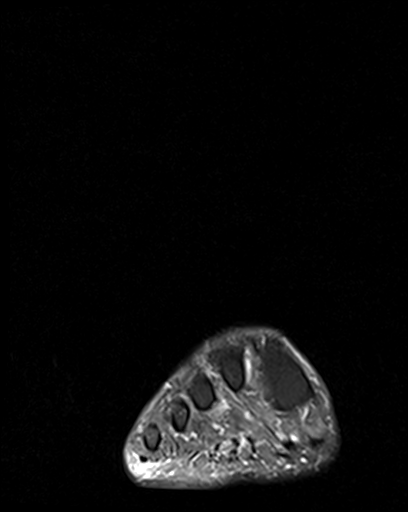
[im 20/36]
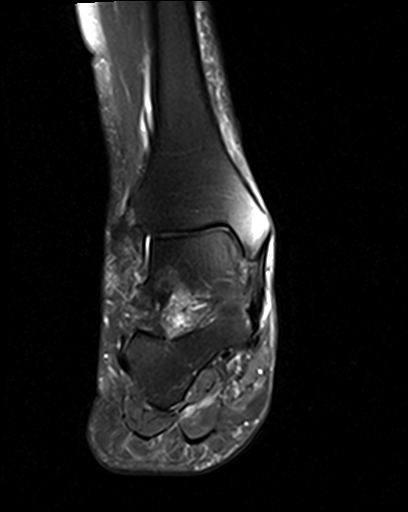
[im 32/36]
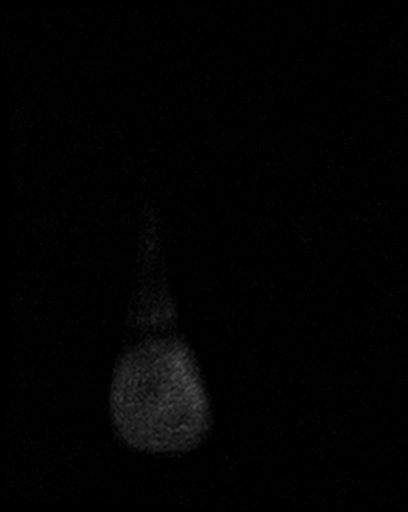

[Series 8: T2 fat-sat · sagittal · 3.0mm · 0.33mm/px · 3 of 19 slices shown (3 of 3)]
[im 1/19]
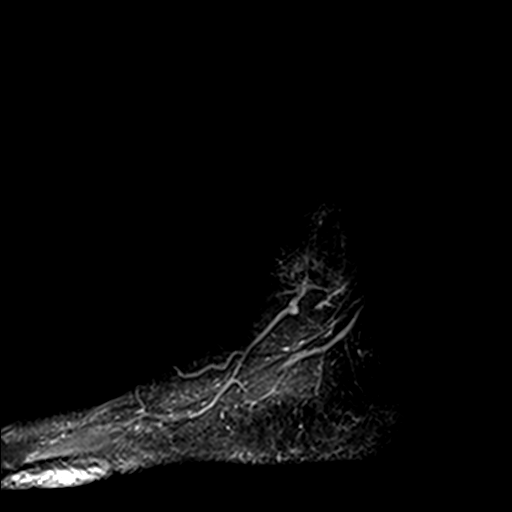
[im 10/19]
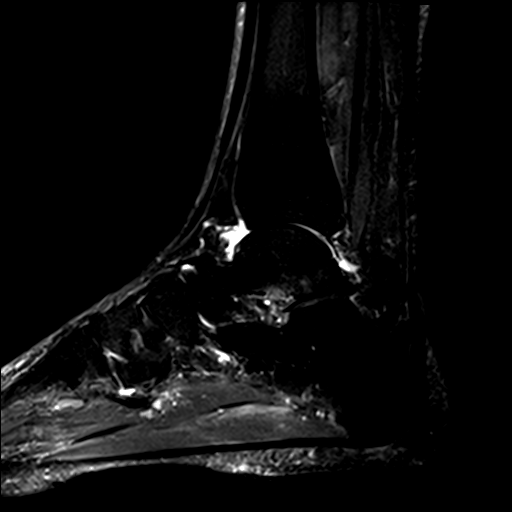
[im 19/19]
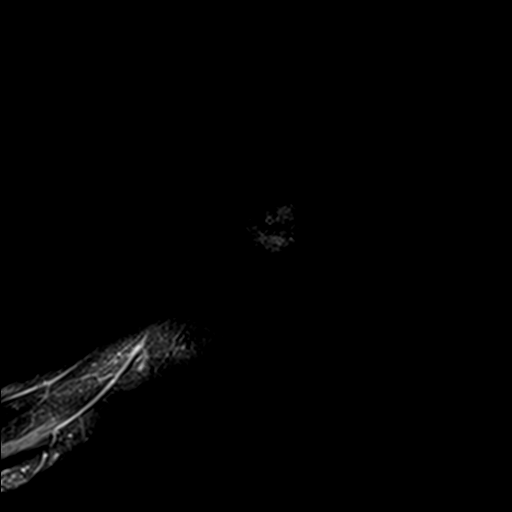

[15 of 40 positions shown; findings below may reference images not displayed]

FINDINGS: TENDONS

Peroneal: Intact peroneus longus and peroneus brevis tendons.

Posteromedial: Intact tibialis posterior, flexor hallucis longus and
flexor digitorum longus tendons.

Anterior: Intact tibialis anterior, extensor hallucis longus and
extensor digitorum longus tendons.

Achilles: Intact.

Plantar Fascia: Intact.

LIGAMENTS

Lateral:  Not well seen.

Medial: Intact

CARTILAGE

Ankle Joint: No joint effusion or chondral defect.

Subtalar Joints/Sinus Tarsi: Abnormal edema and subcortical
sclerosis at the middle facet of the talus in the subtalar join.
This could be the result of a previous bone infarction. I think this
is unlikely to represent metastatic disease. Overlying cartilage
appears normal.

Bones: Abnormal edema and sclerosis of the lateral aspect of the
talus as described above.

Other: None
IMPRESSION: Unusual focal area of subcortical sclerosis and edema adjacent to
the middle talar facet of the subtalar joint. The possibility of a
focal bone infarct should be considered.

## 2018-02-24 ENCOUNTER — Other Ambulatory Visit: Payer: Self-pay | Admitting: Geriatric Medicine

## 2018-02-24 DIAGNOSIS — Z1231 Encounter for screening mammogram for malignant neoplasm of breast: Secondary | ICD-10-CM

## 2018-03-28 ENCOUNTER — Ambulatory Visit
Admission: RE | Admit: 2018-03-28 | Discharge: 2018-03-28 | Disposition: A | Discharge status: Home / Self Care | Payer: Medicare Other | Source: Ambulatory Visit | Attending: Geriatric Medicine | Admitting: Geriatric Medicine

## 2018-03-28 DIAGNOSIS — Z1231 Encounter for screening mammogram for malignant neoplasm of breast: Secondary | ICD-10-CM

## 2018-04-28 ENCOUNTER — Other Ambulatory Visit: Payer: Self-pay

## 2018-04-28 ENCOUNTER — Ambulatory Visit
Admission: RE | Admit: 2018-04-28 | Discharge: 2018-04-28 | Disposition: A | Discharge status: Home / Self Care | Payer: Medicare Other | Source: Ambulatory Visit | Attending: Geriatric Medicine | Admitting: Geriatric Medicine

## 2018-04-28 ENCOUNTER — Other Ambulatory Visit: Payer: Self-pay | Admitting: Geriatric Medicine

## 2018-04-28 DIAGNOSIS — R059 Cough, unspecified: Secondary | ICD-10-CM

## 2018-04-28 DIAGNOSIS — R05 Cough: Secondary | ICD-10-CM

## 2019-02-23 ENCOUNTER — Other Ambulatory Visit: Payer: Self-pay | Admitting: Geriatric Medicine

## 2019-02-23 DIAGNOSIS — Z1231 Encounter for screening mammogram for malignant neoplasm of breast: Secondary | ICD-10-CM

## 2019-04-01 ENCOUNTER — Ambulatory Visit: Payer: Medicare Other

## 2019-04-14 ENCOUNTER — Ambulatory Visit: Payer: Medicare Other

## 2019-05-11 ENCOUNTER — Ambulatory Visit: Payer: Medicare Other

## 2019-06-02 ENCOUNTER — Ambulatory Visit
Admission: RE | Admit: 2019-06-02 | Discharge: 2019-06-02 | Disposition: A | Discharge status: Home / Self Care | Payer: Self-pay | Source: Ambulatory Visit | Attending: Geriatric Medicine | Admitting: Geriatric Medicine

## 2019-06-02 ENCOUNTER — Other Ambulatory Visit: Payer: Self-pay

## 2019-06-02 DIAGNOSIS — Z1231 Encounter for screening mammogram for malignant neoplasm of breast: Secondary | ICD-10-CM

## 2019-08-12 DIAGNOSIS — H5203 Hypermetropia, bilateral: Secondary | ICD-10-CM | POA: Diagnosis not present

## 2019-08-12 DIAGNOSIS — H2513 Age-related nuclear cataract, bilateral: Secondary | ICD-10-CM | POA: Diagnosis not present

## 2019-12-09 DIAGNOSIS — M810 Age-related osteoporosis without current pathological fracture: Secondary | ICD-10-CM | POA: Diagnosis not present

## 2019-12-14 DIAGNOSIS — Z79899 Other long term (current) drug therapy: Secondary | ICD-10-CM | POA: Diagnosis not present

## 2019-12-14 DIAGNOSIS — M810 Age-related osteoporosis without current pathological fracture: Secondary | ICD-10-CM | POA: Diagnosis not present

## 2019-12-14 DIAGNOSIS — Z853 Personal history of malignant neoplasm of breast: Secondary | ICD-10-CM | POA: Diagnosis not present

## 2019-12-14 DIAGNOSIS — Z8249 Family history of ischemic heart disease and other diseases of the circulatory system: Secondary | ICD-10-CM | POA: Diagnosis not present

## 2020-02-08 DIAGNOSIS — D1801 Hemangioma of skin and subcutaneous tissue: Secondary | ICD-10-CM | POA: Diagnosis not present

## 2020-02-08 DIAGNOSIS — L821 Other seborrheic keratosis: Secondary | ICD-10-CM | POA: Diagnosis not present

## 2020-02-08 DIAGNOSIS — Z85828 Personal history of other malignant neoplasm of skin: Secondary | ICD-10-CM | POA: Diagnosis not present

## 2020-02-08 DIAGNOSIS — L7211 Pilar cyst: Secondary | ICD-10-CM | POA: Diagnosis not present

## 2020-02-08 DIAGNOSIS — L814 Other melanin hyperpigmentation: Secondary | ICD-10-CM | POA: Diagnosis not present

## 2020-02-08 DIAGNOSIS — L570 Actinic keratosis: Secondary | ICD-10-CM | POA: Diagnosis not present

## 2020-02-08 DIAGNOSIS — D2271 Melanocytic nevi of right lower limb, including hip: Secondary | ICD-10-CM | POA: Diagnosis not present

## 2020-04-21 ENCOUNTER — Other Ambulatory Visit: Payer: Self-pay | Admitting: Geriatric Medicine

## 2020-04-21 DIAGNOSIS — Z1231 Encounter for screening mammogram for malignant neoplasm of breast: Secondary | ICD-10-CM

## 2020-05-11 DIAGNOSIS — Z0000 Encounter for general adult medical examination without abnormal findings: Secondary | ICD-10-CM | POA: Diagnosis not present

## 2020-05-11 DIAGNOSIS — G3184 Mild cognitive impairment, so stated: Secondary | ICD-10-CM | POA: Diagnosis not present

## 2020-05-11 DIAGNOSIS — R69 Illness, unspecified: Secondary | ICD-10-CM | POA: Diagnosis not present

## 2020-05-11 DIAGNOSIS — Z1389 Encounter for screening for other disorder: Secondary | ICD-10-CM | POA: Diagnosis not present

## 2020-05-11 DIAGNOSIS — Z79899 Other long term (current) drug therapy: Secondary | ICD-10-CM | POA: Diagnosis not present

## 2020-05-11 DIAGNOSIS — I700 Atherosclerosis of aorta: Secondary | ICD-10-CM | POA: Diagnosis not present

## 2020-05-11 DIAGNOSIS — M810 Age-related osteoporosis without current pathological fracture: Secondary | ICD-10-CM | POA: Diagnosis not present

## 2020-05-11 DIAGNOSIS — D7589 Other specified diseases of blood and blood-forming organs: Secondary | ICD-10-CM | POA: Diagnosis not present

## 2020-06-14 ENCOUNTER — Other Ambulatory Visit: Payer: Self-pay

## 2020-06-14 ENCOUNTER — Ambulatory Visit
Admission: RE | Admit: 2020-06-14 | Discharge: 2020-06-14 | Disposition: A | Discharge status: Home / Self Care | Payer: Medicare PPO | Source: Ambulatory Visit | Attending: Geriatric Medicine | Admitting: Geriatric Medicine

## 2020-06-14 DIAGNOSIS — Z1231 Encounter for screening mammogram for malignant neoplasm of breast: Secondary | ICD-10-CM

## 2020-07-13 DIAGNOSIS — U071 COVID-19: Secondary | ICD-10-CM | POA: Diagnosis not present

## 2020-08-04 DIAGNOSIS — M810 Age-related osteoporosis without current pathological fracture: Secondary | ICD-10-CM | POA: Diagnosis not present

## 2020-08-26 DIAGNOSIS — S99912A Unspecified injury of left ankle, initial encounter: Secondary | ICD-10-CM | POA: Diagnosis not present

## 2020-09-12 DIAGNOSIS — S99922A Unspecified injury of left foot, initial encounter: Secondary | ICD-10-CM | POA: Diagnosis not present

## 2020-09-12 DIAGNOSIS — X501XXA Overexertion from prolonged static or awkward postures, initial encounter: Secondary | ICD-10-CM | POA: Diagnosis not present

## 2020-09-12 DIAGNOSIS — Z79899 Other long term (current) drug therapy: Secondary | ICD-10-CM | POA: Diagnosis not present

## 2020-09-12 DIAGNOSIS — S96912A Strain of unspecified muscle and tendon at ankle and foot level, left foot, initial encounter: Secondary | ICD-10-CM | POA: Diagnosis not present

## 2020-10-31 DIAGNOSIS — H5203 Hypermetropia, bilateral: Secondary | ICD-10-CM | POA: Diagnosis not present

## 2020-10-31 DIAGNOSIS — H2513 Age-related nuclear cataract, bilateral: Secondary | ICD-10-CM | POA: Diagnosis not present

## 2020-11-29 ENCOUNTER — Other Ambulatory Visit: Payer: Self-pay | Admitting: Internal Medicine

## 2020-11-29 ENCOUNTER — Ambulatory Visit
Admission: RE | Admit: 2020-11-29 | Discharge: 2020-11-29 | Disposition: A | Discharge status: Home / Self Care | Payer: Medicare PPO | Source: Ambulatory Visit | Attending: Internal Medicine | Admitting: Internal Medicine

## 2020-11-29 DIAGNOSIS — M2578 Osteophyte, vertebrae: Secondary | ICD-10-CM | POA: Diagnosis not present

## 2020-11-29 DIAGNOSIS — R202 Paresthesia of skin: Secondary | ICD-10-CM

## 2020-11-29 DIAGNOSIS — J929 Pleural plaque without asbestos: Secondary | ICD-10-CM | POA: Diagnosis not present

## 2020-11-29 DIAGNOSIS — E559 Vitamin D deficiency, unspecified: Secondary | ICD-10-CM | POA: Diagnosis not present

## 2020-11-29 DIAGNOSIS — M419 Scoliosis, unspecified: Secondary | ICD-10-CM | POA: Diagnosis not present

## 2020-12-26 DIAGNOSIS — Z853 Personal history of malignant neoplasm of breast: Secondary | ICD-10-CM | POA: Diagnosis not present

## 2020-12-26 DIAGNOSIS — Z8349 Family history of other endocrine, nutritional and metabolic diseases: Secondary | ICD-10-CM | POA: Diagnosis not present

## 2020-12-26 DIAGNOSIS — M810 Age-related osteoporosis without current pathological fracture: Secondary | ICD-10-CM | POA: Diagnosis not present

## 2020-12-26 DIAGNOSIS — R030 Elevated blood-pressure reading, without diagnosis of hypertension: Secondary | ICD-10-CM | POA: Diagnosis not present

## 2020-12-26 DIAGNOSIS — R32 Unspecified urinary incontinence: Secondary | ICD-10-CM | POA: Diagnosis not present

## 2020-12-26 DIAGNOSIS — I700 Atherosclerosis of aorta: Secondary | ICD-10-CM | POA: Diagnosis not present

## 2020-12-26 DIAGNOSIS — Z79620 Long term (current) use of immunosuppressive biologic: Secondary | ICD-10-CM | POA: Diagnosis not present

## 2021-02-20 DIAGNOSIS — D2262 Melanocytic nevi of left upper limb, including shoulder: Secondary | ICD-10-CM | POA: Diagnosis not present

## 2021-02-20 DIAGNOSIS — L821 Other seborrheic keratosis: Secondary | ICD-10-CM | POA: Diagnosis not present

## 2021-02-20 DIAGNOSIS — Z85828 Personal history of other malignant neoplasm of skin: Secondary | ICD-10-CM | POA: Diagnosis not present

## 2021-02-20 DIAGNOSIS — L570 Actinic keratosis: Secondary | ICD-10-CM | POA: Diagnosis not present

## 2021-02-20 DIAGNOSIS — D1801 Hemangioma of skin and subcutaneous tissue: Secondary | ICD-10-CM | POA: Diagnosis not present

## 2021-02-20 DIAGNOSIS — L812 Freckles: Secondary | ICD-10-CM | POA: Diagnosis not present

## 2021-02-20 DIAGNOSIS — R202 Paresthesia of skin: Secondary | ICD-10-CM | POA: Diagnosis not present

## 2021-02-23 DIAGNOSIS — M810 Age-related osteoporosis without current pathological fracture: Secondary | ICD-10-CM | POA: Diagnosis not present

## 2021-04-30 DIAGNOSIS — J029 Acute pharyngitis, unspecified: Secondary | ICD-10-CM | POA: Diagnosis not present

## 2021-04-30 DIAGNOSIS — J069 Acute upper respiratory infection, unspecified: Secondary | ICD-10-CM | POA: Diagnosis not present

## 2021-05-15 ENCOUNTER — Other Ambulatory Visit: Payer: Self-pay | Admitting: Geriatric Medicine

## 2021-05-15 DIAGNOSIS — Z1231 Encounter for screening mammogram for malignant neoplasm of breast: Secondary | ICD-10-CM

## 2021-05-17 DIAGNOSIS — B338 Other specified viral diseases: Secondary | ICD-10-CM | POA: Diagnosis not present

## 2021-05-17 DIAGNOSIS — J029 Acute pharyngitis, unspecified: Secondary | ICD-10-CM | POA: Diagnosis not present

## 2021-05-17 DIAGNOSIS — J069 Acute upper respiratory infection, unspecified: Secondary | ICD-10-CM | POA: Diagnosis not present

## 2021-05-17 DIAGNOSIS — Z03818 Encounter for observation for suspected exposure to other biological agents ruled out: Secondary | ICD-10-CM | POA: Diagnosis not present

## 2021-06-15 ENCOUNTER — Ambulatory Visit
Admission: RE | Admit: 2021-06-15 | Discharge: 2021-06-15 | Disposition: A | Discharge status: Home / Self Care | Payer: Medicare PPO | Source: Ambulatory Visit | Attending: Geriatric Medicine | Admitting: Geriatric Medicine

## 2021-06-15 DIAGNOSIS — Z1231 Encounter for screening mammogram for malignant neoplasm of breast: Secondary | ICD-10-CM

## 2021-06-15 HISTORY — DX: Personal history of irradiation: Z92.3

## 2021-06-15 HISTORY — DX: Malignant neoplasm of unspecified site of unspecified female breast: C50.919

## 2021-06-16 DIAGNOSIS — M810 Age-related osteoporosis without current pathological fracture: Secondary | ICD-10-CM | POA: Diagnosis not present

## 2021-06-16 DIAGNOSIS — D7589 Other specified diseases of blood and blood-forming organs: Secondary | ICD-10-CM | POA: Diagnosis not present

## 2021-06-16 DIAGNOSIS — I700 Atherosclerosis of aorta: Secondary | ICD-10-CM | POA: Diagnosis not present

## 2021-06-16 DIAGNOSIS — Z79899 Other long term (current) drug therapy: Secondary | ICD-10-CM | POA: Diagnosis not present

## 2021-06-16 DIAGNOSIS — Z0000 Encounter for general adult medical examination without abnormal findings: Secondary | ICD-10-CM | POA: Diagnosis not present

## 2021-06-16 DIAGNOSIS — H6122 Impacted cerumen, left ear: Secondary | ICD-10-CM | POA: Diagnosis not present

## 2021-06-19 DIAGNOSIS — H6122 Impacted cerumen, left ear: Secondary | ICD-10-CM | POA: Diagnosis not present

## 2021-08-03 DIAGNOSIS — H6122 Impacted cerumen, left ear: Secondary | ICD-10-CM | POA: Diagnosis not present

## 2021-08-30 DIAGNOSIS — M810 Age-related osteoporosis without current pathological fracture: Secondary | ICD-10-CM | POA: Diagnosis not present

## 2021-11-29 DIAGNOSIS — H52203 Unspecified astigmatism, bilateral: Secondary | ICD-10-CM | POA: Diagnosis not present

## 2021-11-29 DIAGNOSIS — H2513 Age-related nuclear cataract, bilateral: Secondary | ICD-10-CM | POA: Diagnosis not present

## 2022-05-08 ENCOUNTER — Other Ambulatory Visit: Payer: Self-pay | Admitting: Internal Medicine

## 2022-05-08 DIAGNOSIS — Z1231 Encounter for screening mammogram for malignant neoplasm of breast: Secondary | ICD-10-CM

## 2022-06-22 ENCOUNTER — Ambulatory Visit
Admission: RE | Admit: 2022-06-22 | Discharge: 2022-06-22 | Disposition: A | Discharge status: Home / Self Care | Payer: Medicare PPO | Source: Ambulatory Visit | Attending: Internal Medicine | Admitting: Internal Medicine

## 2022-06-22 DIAGNOSIS — Z1231 Encounter for screening mammogram for malignant neoplasm of breast: Secondary | ICD-10-CM

## 2022-12-06 DIAGNOSIS — H2513 Age-related nuclear cataract, bilateral: Secondary | ICD-10-CM | POA: Diagnosis not present

## 2022-12-06 DIAGNOSIS — H5203 Hypermetropia, bilateral: Secondary | ICD-10-CM | POA: Diagnosis not present

## 2022-12-06 DIAGNOSIS — H52203 Unspecified astigmatism, bilateral: Secondary | ICD-10-CM | POA: Diagnosis not present

## 2023-03-04 DIAGNOSIS — D2271 Melanocytic nevi of right lower limb, including hip: Secondary | ICD-10-CM | POA: Diagnosis not present

## 2023-03-04 DIAGNOSIS — L821 Other seborrheic keratosis: Secondary | ICD-10-CM | POA: Diagnosis not present

## 2023-03-04 DIAGNOSIS — D0439 Carcinoma in situ of skin of other parts of face: Secondary | ICD-10-CM | POA: Diagnosis not present

## 2023-03-04 DIAGNOSIS — D0461 Carcinoma in situ of skin of right upper limb, including shoulder: Secondary | ICD-10-CM | POA: Diagnosis not present

## 2023-03-04 DIAGNOSIS — D2272 Melanocytic nevi of left lower limb, including hip: Secondary | ICD-10-CM | POA: Diagnosis not present

## 2023-03-04 DIAGNOSIS — Z85828 Personal history of other malignant neoplasm of skin: Secondary | ICD-10-CM | POA: Diagnosis not present

## 2023-03-04 DIAGNOSIS — D485 Neoplasm of uncertain behavior of skin: Secondary | ICD-10-CM | POA: Diagnosis not present

## 2023-03-13 DIAGNOSIS — M810 Age-related osteoporosis without current pathological fracture: Secondary | ICD-10-CM | POA: Diagnosis not present

## 2023-04-25 DIAGNOSIS — Z85828 Personal history of other malignant neoplasm of skin: Secondary | ICD-10-CM | POA: Diagnosis not present

## 2023-04-25 DIAGNOSIS — L821 Other seborrheic keratosis: Secondary | ICD-10-CM | POA: Diagnosis not present

## 2023-04-25 DIAGNOSIS — D0430 Carcinoma in situ of skin of unspecified part of face: Secondary | ICD-10-CM | POA: Diagnosis not present

## 2023-04-25 DIAGNOSIS — D0439 Carcinoma in situ of skin of other parts of face: Secondary | ICD-10-CM | POA: Diagnosis not present

## 2023-04-25 DIAGNOSIS — D0461 Carcinoma in situ of skin of right upper limb, including shoulder: Secondary | ICD-10-CM | POA: Diagnosis not present

## 2023-04-30 DIAGNOSIS — Z961 Presence of intraocular lens: Secondary | ICD-10-CM | POA: Diagnosis not present

## 2023-04-30 DIAGNOSIS — H2511 Age-related nuclear cataract, right eye: Secondary | ICD-10-CM | POA: Diagnosis not present

## 2023-04-30 DIAGNOSIS — H25811 Combined forms of age-related cataract, right eye: Secondary | ICD-10-CM | POA: Diagnosis not present

## 2023-05-13 ENCOUNTER — Other Ambulatory Visit: Payer: Self-pay | Admitting: Internal Medicine

## 2023-05-13 DIAGNOSIS — Z1231 Encounter for screening mammogram for malignant neoplasm of breast: Secondary | ICD-10-CM

## 2023-06-09 DIAGNOSIS — L03039 Cellulitis of unspecified toe: Secondary | ICD-10-CM | POA: Diagnosis not present

## 2023-06-11 DIAGNOSIS — H25812 Combined forms of age-related cataract, left eye: Secondary | ICD-10-CM | POA: Diagnosis not present

## 2023-06-11 DIAGNOSIS — H2512 Age-related nuclear cataract, left eye: Secondary | ICD-10-CM | POA: Diagnosis not present

## 2023-06-11 DIAGNOSIS — Z961 Presence of intraocular lens: Secondary | ICD-10-CM | POA: Diagnosis not present

## 2023-06-24 ENCOUNTER — Ambulatory Visit
Admission: RE | Admit: 2023-06-24 | Discharge: 2023-06-24 | Disposition: A | Discharge status: Home / Self Care | Source: Ambulatory Visit | Attending: Internal Medicine | Admitting: Internal Medicine

## 2023-06-24 DIAGNOSIS — Z1231 Encounter for screening mammogram for malignant neoplasm of breast: Secondary | ICD-10-CM

## 2023-07-01 DIAGNOSIS — Z1331 Encounter for screening for depression: Secondary | ICD-10-CM | POA: Diagnosis not present

## 2023-07-01 DIAGNOSIS — M810 Age-related osteoporosis without current pathological fracture: Secondary | ICD-10-CM | POA: Diagnosis not present

## 2023-07-01 DIAGNOSIS — Z79899 Other long term (current) drug therapy: Secondary | ICD-10-CM | POA: Diagnosis not present

## 2023-07-01 DIAGNOSIS — Z23 Encounter for immunization: Secondary | ICD-10-CM | POA: Diagnosis not present

## 2023-07-01 DIAGNOSIS — R634 Abnormal weight loss: Secondary | ICD-10-CM | POA: Diagnosis not present

## 2023-07-01 DIAGNOSIS — Z0000 Encounter for general adult medical examination without abnormal findings: Secondary | ICD-10-CM | POA: Diagnosis not present

## 2023-09-12 DIAGNOSIS — M810 Age-related osteoporosis without current pathological fracture: Secondary | ICD-10-CM | POA: Diagnosis not present

## 2023-10-08 DIAGNOSIS — M810 Age-related osteoporosis without current pathological fracture: Secondary | ICD-10-CM | POA: Diagnosis not present

## 2023-10-13 DIAGNOSIS — M810 Age-related osteoporosis without current pathological fracture: Secondary | ICD-10-CM | POA: Diagnosis not present

## 2023-11-11 DIAGNOSIS — Z85828 Personal history of other malignant neoplasm of skin: Secondary | ICD-10-CM | POA: Diagnosis not present

## 2023-11-11 DIAGNOSIS — L7211 Pilar cyst: Secondary | ICD-10-CM | POA: Diagnosis not present

## 2023-11-12 DIAGNOSIS — M810 Age-related osteoporosis without current pathological fracture: Secondary | ICD-10-CM | POA: Diagnosis not present

## 2023-11-27 DIAGNOSIS — Z9181 History of falling: Secondary | ICD-10-CM | POA: Diagnosis not present

## 2023-11-27 DIAGNOSIS — S76012A Strain of muscle, fascia and tendon of left hip, initial encounter: Secondary | ICD-10-CM | POA: Diagnosis not present

## 2023-11-27 DIAGNOSIS — R102 Pelvic and perineal pain unspecified side: Secondary | ICD-10-CM | POA: Diagnosis not present

## 2023-12-11 DIAGNOSIS — M25551 Pain in right hip: Secondary | ICD-10-CM | POA: Diagnosis not present

## 2023-12-13 DIAGNOSIS — R2681 Unsteadiness on feet: Secondary | ICD-10-CM | POA: Diagnosis not present

## 2023-12-13 DIAGNOSIS — M25551 Pain in right hip: Secondary | ICD-10-CM | POA: Diagnosis not present

## 2023-12-13 DIAGNOSIS — M7631 Iliotibial band syndrome, right leg: Secondary | ICD-10-CM | POA: Diagnosis not present

## 2023-12-13 DIAGNOSIS — M810 Age-related osteoporosis without current pathological fracture: Secondary | ICD-10-CM | POA: Diagnosis not present

## 2023-12-18 DIAGNOSIS — M7631 Iliotibial band syndrome, right leg: Secondary | ICD-10-CM | POA: Diagnosis not present

## 2023-12-18 DIAGNOSIS — R2681 Unsteadiness on feet: Secondary | ICD-10-CM | POA: Diagnosis not present

## 2023-12-18 DIAGNOSIS — M25551 Pain in right hip: Secondary | ICD-10-CM | POA: Diagnosis not present

## 2023-12-20 DIAGNOSIS — R2681 Unsteadiness on feet: Secondary | ICD-10-CM | POA: Diagnosis not present

## 2023-12-20 DIAGNOSIS — M25551 Pain in right hip: Secondary | ICD-10-CM | POA: Diagnosis not present

## 2023-12-20 DIAGNOSIS — M7631 Iliotibial band syndrome, right leg: Secondary | ICD-10-CM | POA: Diagnosis not present

## 2023-12-23 DIAGNOSIS — M25551 Pain in right hip: Secondary | ICD-10-CM | POA: Diagnosis not present

## 2023-12-23 DIAGNOSIS — R2681 Unsteadiness on feet: Secondary | ICD-10-CM | POA: Diagnosis not present

## 2023-12-23 DIAGNOSIS — M7631 Iliotibial band syndrome, right leg: Secondary | ICD-10-CM | POA: Diagnosis not present

## 2023-12-26 DIAGNOSIS — M5431 Sciatica, right side: Secondary | ICD-10-CM | POA: Diagnosis not present

## 2023-12-26 DIAGNOSIS — M51362 Other intervertebral disc degeneration, lumbar region with discogenic back pain and lower extremity pain: Secondary | ICD-10-CM | POA: Diagnosis not present

## 2023-12-26 DIAGNOSIS — M5416 Radiculopathy, lumbar region: Secondary | ICD-10-CM | POA: Diagnosis not present

## 2023-12-27 DIAGNOSIS — M7631 Iliotibial band syndrome, right leg: Secondary | ICD-10-CM | POA: Diagnosis not present

## 2023-12-27 DIAGNOSIS — R2681 Unsteadiness on feet: Secondary | ICD-10-CM | POA: Diagnosis not present

## 2023-12-27 DIAGNOSIS — M25551 Pain in right hip: Secondary | ICD-10-CM | POA: Diagnosis not present

## 2023-12-30 DIAGNOSIS — R2681 Unsteadiness on feet: Secondary | ICD-10-CM | POA: Diagnosis not present

## 2023-12-30 DIAGNOSIS — M7631 Iliotibial band syndrome, right leg: Secondary | ICD-10-CM | POA: Diagnosis not present

## 2023-12-30 DIAGNOSIS — M25551 Pain in right hip: Secondary | ICD-10-CM | POA: Diagnosis not present

## 2024-01-03 DIAGNOSIS — M25551 Pain in right hip: Secondary | ICD-10-CM | POA: Diagnosis not present

## 2024-01-03 DIAGNOSIS — M7631 Iliotibial band syndrome, right leg: Secondary | ICD-10-CM | POA: Diagnosis not present

## 2024-01-03 DIAGNOSIS — R2681 Unsteadiness on feet: Secondary | ICD-10-CM | POA: Diagnosis not present

## 2024-01-12 DIAGNOSIS — M810 Age-related osteoporosis without current pathological fracture: Secondary | ICD-10-CM | POA: Diagnosis not present

## 2024-01-17 DIAGNOSIS — M25551 Pain in right hip: Secondary | ICD-10-CM | POA: Diagnosis not present

## 2024-01-17 DIAGNOSIS — M7631 Iliotibial band syndrome, right leg: Secondary | ICD-10-CM | POA: Diagnosis not present

## 2024-01-17 DIAGNOSIS — R2681 Unsteadiness on feet: Secondary | ICD-10-CM | POA: Diagnosis not present
